# Patient Record
Sex: Male | Born: 1950 | Race: White | Hispanic: No | Marital: Married | Smoking: Former smoker
Health system: Southern US, Community
[De-identification: ages and names within clinical notes are randomized; demographics above are authoritative.]

## PROBLEM LIST (undated history)

## (undated) DIAGNOSIS — I2510 Atherosclerotic heart disease of native coronary artery without angina pectoris: Secondary | ICD-10-CM

## (undated) DIAGNOSIS — E785 Hyperlipidemia, unspecified: Secondary | ICD-10-CM

## (undated) DIAGNOSIS — I700 Atherosclerosis of aorta: Secondary | ICD-10-CM

## (undated) DIAGNOSIS — F329 Major depressive disorder, single episode, unspecified: Secondary | ICD-10-CM

## (undated) DIAGNOSIS — E559 Vitamin D deficiency, unspecified: Secondary | ICD-10-CM

## (undated) DIAGNOSIS — N529 Male erectile dysfunction, unspecified: Secondary | ICD-10-CM

## (undated) DIAGNOSIS — J449 Chronic obstructive pulmonary disease, unspecified: Secondary | ICD-10-CM

## (undated) DIAGNOSIS — Z87442 Personal history of urinary calculi: Secondary | ICD-10-CM

## (undated) DIAGNOSIS — G4730 Sleep apnea, unspecified: Secondary | ICD-10-CM

## (undated) DIAGNOSIS — L03114 Cellulitis of left upper limb: Secondary | ICD-10-CM

## (undated) DIAGNOSIS — L719 Rosacea, unspecified: Secondary | ICD-10-CM

## (undated) DIAGNOSIS — F32A Depression, unspecified: Secondary | ICD-10-CM

## (undated) HISTORY — PX: INCISION AND DRAINAGE ABSCESS: SHX5864

## (undated) HISTORY — DX: Chronic obstructive pulmonary disease, unspecified: J44.9

## (undated) HISTORY — DX: Atherosclerotic heart disease of native coronary artery without angina pectoris: I25.10

## (undated) HISTORY — DX: Rosacea, unspecified: L71.9

## (undated) HISTORY — DX: Atherosclerosis of aorta: I70.0

## (undated) HISTORY — DX: Vitamin D deficiency, unspecified: E55.9

## (undated) HISTORY — PX: KNEE ARTHROSCOPY: SUR90

## (undated) HISTORY — PX: SHOULDER SURGERY: SHX246

---

## 1981-01-29 HISTORY — PX: APPENDECTOMY: SHX54

## 1997-08-12 ENCOUNTER — Ambulatory Visit (HOSPITAL_BASED_OUTPATIENT_CLINIC_OR_DEPARTMENT_OTHER): Admission: RE | Admit: 1997-08-12 | Discharge: 1997-08-12 | Payer: Self-pay | Admitting: General Surgery

## 1997-11-05 ENCOUNTER — Ambulatory Visit (HOSPITAL_COMMUNITY): Admission: RE | Admit: 1997-11-05 | Discharge: 1997-11-05 | Payer: Self-pay | Admitting: Gastroenterology

## 1997-11-05 ENCOUNTER — Encounter: Payer: Self-pay | Admitting: Gastroenterology

## 1998-01-29 HISTORY — PX: HERNIA REPAIR: SHX51

## 1998-04-02 ENCOUNTER — Encounter: Payer: Self-pay | Admitting: Emergency Medicine

## 1998-04-02 ENCOUNTER — Emergency Department (HOSPITAL_COMMUNITY): Admission: EM | Admit: 1998-04-02 | Discharge: 1998-04-02 | Payer: Self-pay | Admitting: Emergency Medicine

## 2004-02-17 ENCOUNTER — Encounter: Admission: RE | Admit: 2004-02-17 | Discharge: 2004-02-17 | Payer: Self-pay | Admitting: Family Medicine

## 2004-04-11 ENCOUNTER — Ambulatory Visit (HOSPITAL_COMMUNITY): Admission: RE | Admit: 2004-04-11 | Discharge: 2004-04-11 | Payer: Self-pay | Admitting: *Deleted

## 2014-08-06 DIAGNOSIS — E782 Mixed hyperlipidemia: Secondary | ICD-10-CM | POA: Insufficient documentation

## 2014-08-06 DIAGNOSIS — L03114 Cellulitis of left upper limb: Secondary | ICD-10-CM

## 2014-08-06 DIAGNOSIS — F329 Major depressive disorder, single episode, unspecified: Secondary | ICD-10-CM | POA: Insufficient documentation

## 2014-08-06 DIAGNOSIS — F32A Depression, unspecified: Secondary | ICD-10-CM | POA: Insufficient documentation

## 2014-08-06 DIAGNOSIS — L03119 Cellulitis of unspecified part of limb: Secondary | ICD-10-CM | POA: Insufficient documentation

## 2014-08-06 DIAGNOSIS — J449 Chronic obstructive pulmonary disease, unspecified: Secondary | ICD-10-CM | POA: Insufficient documentation

## 2014-08-06 HISTORY — DX: Cellulitis of left upper limb: L03.114

## 2015-04-20 DIAGNOSIS — R252 Cramp and spasm: Secondary | ICD-10-CM | POA: Insufficient documentation

## 2015-04-20 DIAGNOSIS — N529 Male erectile dysfunction, unspecified: Secondary | ICD-10-CM | POA: Insufficient documentation

## 2016-02-07 DIAGNOSIS — H40053 Ocular hypertension, bilateral: Secondary | ICD-10-CM | POA: Diagnosis not present

## 2016-02-07 DIAGNOSIS — H5213 Myopia, bilateral: Secondary | ICD-10-CM | POA: Diagnosis not present

## 2016-02-07 DIAGNOSIS — H2513 Age-related nuclear cataract, bilateral: Secondary | ICD-10-CM | POA: Diagnosis not present

## 2016-03-15 DIAGNOSIS — L700 Acne vulgaris: Secondary | ICD-10-CM | POA: Diagnosis not present

## 2016-03-15 DIAGNOSIS — L718 Other rosacea: Secondary | ICD-10-CM | POA: Diagnosis not present

## 2016-03-15 DIAGNOSIS — D485 Neoplasm of uncertain behavior of skin: Secondary | ICD-10-CM | POA: Diagnosis not present

## 2016-03-15 DIAGNOSIS — L821 Other seborrheic keratosis: Secondary | ICD-10-CM | POA: Diagnosis not present

## 2016-04-03 DIAGNOSIS — E789 Disorder of lipoprotein metabolism, unspecified: Secondary | ICD-10-CM | POA: Diagnosis not present

## 2016-04-03 DIAGNOSIS — M542 Cervicalgia: Secondary | ICD-10-CM | POA: Diagnosis not present

## 2016-04-03 DIAGNOSIS — F4320 Adjustment disorder, unspecified: Secondary | ICD-10-CM | POA: Diagnosis not present

## 2016-04-04 DIAGNOSIS — M1990 Unspecified osteoarthritis, unspecified site: Secondary | ICD-10-CM | POA: Diagnosis not present

## 2016-04-04 DIAGNOSIS — M542 Cervicalgia: Secondary | ICD-10-CM | POA: Diagnosis not present

## 2016-04-06 DIAGNOSIS — F17210 Nicotine dependence, cigarettes, uncomplicated: Secondary | ICD-10-CM | POA: Diagnosis not present

## 2016-04-06 DIAGNOSIS — G4733 Obstructive sleep apnea (adult) (pediatric): Secondary | ICD-10-CM | POA: Diagnosis not present

## 2016-04-06 DIAGNOSIS — J449 Chronic obstructive pulmonary disease, unspecified: Secondary | ICD-10-CM | POA: Diagnosis not present

## 2016-04-16 ENCOUNTER — Ambulatory Visit: Payer: PPO | Attending: Endocrinology | Admitting: Physical Therapy

## 2016-04-16 DIAGNOSIS — R293 Abnormal posture: Secondary | ICD-10-CM | POA: Insufficient documentation

## 2016-04-16 DIAGNOSIS — M25511 Pain in right shoulder: Secondary | ICD-10-CM | POA: Insufficient documentation

## 2016-04-16 DIAGNOSIS — G8929 Other chronic pain: Secondary | ICD-10-CM | POA: Diagnosis not present

## 2016-04-16 DIAGNOSIS — M542 Cervicalgia: Secondary | ICD-10-CM | POA: Insufficient documentation

## 2016-04-16 DIAGNOSIS — M5412 Radiculopathy, cervical region: Secondary | ICD-10-CM | POA: Diagnosis not present

## 2016-04-16 NOTE — Therapy (Signed)
[facility] [address]  [place], [place], [address] Phone: [phone]   Fax:  [phone]  Physical Therapy Evaluation  Patient Details  Name: Patrick [place] MRN: [id number] Date of Birth: 08-11-1950 Referring Provider: Arleta Creek, MD  Encounter Date: 04/16/2016      PT End of Session - 04/16/16 1445    Visit Number 1   Number of Visits 12   Date for PT Re-Evaluation 06/01/16   Authorization Type [employer] - follow Medicare guidelines   PT Start Time 3212   PT Stop Time 1535   PT Time Calculation (min) 50 min   Activity Tolerance Patient tolerated treatment well   Behavior During Therapy [facility] for tasks assessed/performed      No past medical history on file.  No past surgical history on file.  There were no vitals filed for this visit.       Subjective Assessment - 04/16/16 1451    Subjective Last May, pt reports picking up a banquet table when he felt a pull on the right side of his neck. Pain has fluctuated on and off since. Limits sleep and playing golf. Saw a chriopractor with minimal relief. States his rheumatologist has iven him some stretches and exercises, but again limited response noted. pain typically worst later in the day.   Patient Stated Goals "less pain"    Currently in Pain? No/denies   Pain Score 0-No pain  least 0/10, avg 7/10, worst 8-9/10   Pain Location Neck   Pain Orientation Right;Lateral   Pain Descriptors / Indicators Throbbing   Pain Type Chronic pain   Pain Radiating Towards intermittent tingling in L hand (middle fingers)   Pain Onset More than a month ago  May 2016   Pain Frequency Intermittent   Aggravating Factors  worst later in the day, moving neck - all directions, lying down   Pain Relieving Factors heat, Ibuprofen   Effect of Pain on Daily Activities pain with ADLs - applying deoderant, drying hair; difficulty turning head to check  blindspot while driving; limits playing golf            [facility] PT Assessment - 04/16/16 1445      Assessment   Medical Diagnosis R shoulder & neck pain   Referring Provider Arleta Creek, MD   Onset Date/Surgical Date --  May 2017   Hand Dominance Right     Balance Screen   Has the patient fallen in the past 6 months Yes   How many times? 2   Has the patient had a decrease in activity level because of a fear of falling?  No   Is the patient reluctant to leave their home because of a fear of falling?  No     Home Environment   Living Environment Private residence   Type of [place] to enter   Entrance Stairs-Number of Steps 13 from garage   Entrance Stairs-Rails Left   [place] of Steps tri-level house with 3-4 steps btw levels     Prior Function   Level of Independence Independent   Vocation Retired   Leisure walking at least 3x/wk; golf - weekly     Observation/Other Assessments   Focus on Therapeutic Outcomes (FOTO)  Neck 47% (53% limitation); predicted 61% (39% limitation)     Posture/Postural Control   Posture/Postural Control Postural limitations  Postural Limitations Forward head;Rounded Shoulders     ROM / Strength   AROM / PROM / Strength AROM;Strength     AROM   Overall AROM Comments B shoulder WNL but increased discomfort noted on R   AROM Assessment Site Cervical;Shoulder   Cervical Flexion 44  pain/stiffness   Cervical Extension 35  pain   Cervical - Right Side Bend 18  pain   Cervical - Left Side Bend 26  stiffness   Cervical - Right Rotation 34  pain   Cervical - Left Rotation 56     Strength   Overall Strength Within functional limits for tasks performed   Overall Strength Comments B shoulders 5/5   Strength Assessment Site Shoulder     Palpation   Palpation comment increased muscle tension in R cervical paraspinals, UT & pecs                   [facility]  Adult PT Treatment/Exercise - 04/16/16 1445      Self-Care   Self-Care Posture   Posture Instructed pt in neutral posture for spine and shoulders     Exercises   Exercises Neck     Neck Exercises: Seated   Neck Retraction 10 reps;5 secs   Other Seated Exercise Scapular retraction 10x5"     Neck Exercises: Stretches   Upper Trapezius Stretch 30 seconds;2 reps   Upper Trapezius Stretch Limitations seated with hand on edge of chair to avoid hiking shoulder   Corner Stretch 30 seconds;1 rep   Corner Stretch Limitations 3 way doorway stretch - low/mid/high                 PT Education - 04/16/16 1532    Education provided Yes   Education Details PT eval findings, POC, postural awareness & initial HEP   Person(s) Educated Patient   Methods Explanation;Demonstration;Handout   Comprehension Verbalized understanding;Returned demonstration;Need further instruction          PT Short Term Goals - 04/16/16 1535      PT SHORT TERM GOAL #1   Title Independent with initial HEP by 04/30/16   Status New     PT SHORT TERM GOAL #2   Title Pt will verbalize understanding of neutral spine and shoulder posture by 04/30/16   Status New           PT Long Term Goals - 04/16/16 1535      PT LONG TERM GOAL #1   Title Independent with advanced HEP as indicated by 06/01/16   Status New     PT LONG TERM GOAL #2   Title Cervical ROM WFL w/o increased pain by 06/01/16   Status New     PT LONG TERM GOAL #3   Title Pt will report no limitation in ADLs, daily chores, driving or leisure activities (golf) due to neck or R shoulder pain by 06/01/16   Status New               Plan - 04/16/16 1535    Clinical Impression Statement Patrick [place] is a 66 y/o R hand dominant male who presents to OP PT R neck and shoulder pain dating back to May 2017 when he felt sudden onset of pain while lifting a banquet table.  Pt reports it felt like he had pulled a muscle at the time but pain has fluctuated on  and off since, with some intermittent numbness and tingling in the L hand. Assessment revealed forward head and shoulder  posture with increased muscle tension in pecs, cervical paraspinals, upper trap and levator scapulae R>L. Cervical ROM decreased in all planes with tightness/stiffness reported in flexion, B rotation and sidebending; along with increased pain for all motions. B shoulder ROM and strength WNL. Pt ttp over R lateral cervical musculature. Manual cervical distraction resolved pain/radicular symptoms, but unable to reproduce symptoms with axial loading. Pain makes self-care ADLs difficult and limits the patient's tolerance for driving and playing golf. POC will focus on postural and body mechanics training to improve functional alignment, improving cervical flexibility along with soft tissue pliability, stretching to restore normal cervical ROM, scapular stabilization and shoulder strengthening, with manual therapy and modalities PRN for pain. May also consider mechanical traction, taping and/or DN as indicated.   Rehab Potential Good   PT Frequency 2x / week   PT Duration 6 weeks   PT Treatment/Interventions Patient/family education;Neuromuscular re-education;Therapeutic exercise;Therapeutic activities;Functional mobility training;Manual techniques;Dry needling;Taping;Traction;Electrical Stimulation;Moist Heat;Cryotherapy;Ultrasound;Iontophoresis 4mg /ml Dexamethasone;ADLs/Self Care Home Management   PT Next Visit Plan Review initial HEP; Posture & body mechanics education; Postural strengthening - scapular stabilization + anterior stretching; Manual therapy including manual traction - consider mechanical traction if continued benefit noted; Modalities PRN   Consulted and Agree with Plan of Care Patient      Patient will benefit from skilled therapeutic intervention in order to improve the following deficits and impairments:  Pain, Decreased range of motion, Impaired flexibility, Increased  muscle spasms, Postural dysfunction, Improper body mechanics, Impaired sensation, Decreased activity tolerance  Visit Diagnosis: Cervicalgia  Radiculopathy, cervical region  Chronic right shoulder pain  Abnormal posture      G-Codes - 05-10-16 1741    Functional Assessment Tool Used (Outpatient Only) Neck FOTO = 47% (53% limitation)   Functional Limitation Changing and maintaining body position   Changing and Maintaining Body Position Current Status (W2956) At least 40 percent but less than 60 percent impaired, limited or restricted   Changing and Maintaining Body Position Goal Status (O1308) At least 20 percent but less than 40 percent impaired, limited or restricted       Problem List There are no active problems to display for this patient.   Percival Spanish, PT, MPT 05/10/16, 6:16 PM  [facility] [address]  [place], [place], [address] Phone: [phone]   Fax:  [phone]  Name: Patrick [place] MRN: [id number] Date of Birth: 13-Jul-1950

## 2016-04-19 ENCOUNTER — Ambulatory Visit: Payer: PPO | Admitting: Physical Therapy

## 2016-04-25 ENCOUNTER — Ambulatory Visit: Payer: PPO

## 2016-04-25 DIAGNOSIS — M542 Cervicalgia: Secondary | ICD-10-CM

## 2016-04-25 DIAGNOSIS — R293 Abnormal posture: Secondary | ICD-10-CM

## 2016-04-25 DIAGNOSIS — G8929 Other chronic pain: Secondary | ICD-10-CM

## 2016-04-25 DIAGNOSIS — M25511 Pain in right shoulder: Secondary | ICD-10-CM

## 2016-04-25 DIAGNOSIS — M5412 Radiculopathy, cervical region: Secondary | ICD-10-CM

## 2016-04-25 NOTE — Therapy (Signed)
[facility] [address]  [place], [place], [address] Phone: [phone]   Fax:  [phone]  Physical Therapy Treatment  Patient Details  Name: Patrick [place] MRN: [id number] Date of Birth: 17-Oct-1950 Referring Provider: Arleta Creek, MD  Encounter Date: 04/25/2016      PT End of Session - 04/25/16 0859    Visit Number 2   Number of Visits 12   Date for PT Re-Evaluation 06/01/16   Authorization Type [employer] - follow Medicare guidelines   PT Start Time 0846   PT Stop Time 0945   PT Time Calculation (min) 59 min   Activity Tolerance Patient tolerated treatment well   Behavior During Therapy [facility] for tasks assessed/performed      No past medical history on file.  No past surgical history on file.  There were no vitals filed for this visit.      Subjective Assessment - 04/25/16 0847    Subjective Pt. reporting neck stiffness today.  Pt. reporting R UE numbness/tingling with nearly all motions still.     Patient Stated Goals "less pain"    Currently in Pain? No/denies   Multiple Pain Sites No                         [facility] Adult PT Treatment/Exercise - 04/25/16 0856      Self-Care   Self-Care Posture;Other Self-Care Comments   Posture postural handout; discussion with visual of proper computer set up and proper sitting/standing posture      Neck Exercises: Machines for Strengthening   UBE (Upper Arm Bike) UBE: Lvl 1.0, 3 min each way     Neck Exercises: Seated   Neck Retraction 10 reps;5 secs   Other Seated Exercise Scapular retraction 10x5"     Shoulder Exercises: Standing   Extension AROM;Both;10 reps;Theraband  5" hold    Theraband Level (Shoulder Extension) Level 2 (Red)   Row AROM;10 reps;Theraband;Both  5" hold    Theraband Level (Shoulder Row) Level 2 (Red)     Modalities   Modalities Electrical Stimulation;Moist Heat     Moist Heat Therapy    Number Minutes Moist Heat 15 Minutes   Moist Heat Location Cervical     Electrical Stimulation   Electrical Stimulation Location R cervical, R UT    Electrical Stimulation Action IFC    Electrical Stimulation Parameters 80-150 Hz, intensity to pt. tolerance, 15'    Electrical Stimulation Goals Pain     Neck Exercises: Stretches   Upper Trapezius Stretch 30 seconds;2 reps   Upper Trapezius Stretch Limitations seated with hand on edge of chair to avoid hiking shoulder   Levator Stretch 2 reps;30 seconds   Levator Stretch Limitations Seated with off arm anchored    Warehouse manager 30 seconds;2 reps   Corner Stretch Limitations 3 way doorway stretch - low/mid/high                   PT Short Term Goals - 04/25/16 0859      PT SHORT TERM GOAL #1   Title Independent with initial HEP by 04/30/16   Status On-going     PT SHORT TERM GOAL #2   Title Pt will verbalize understanding of neutral spine and shoulder posture by 04/30/16   Status On-going           PT Long Term Goals - 04/25/16 0859      PT  LONG TERM GOAL #1   Title Independent with advanced HEP as indicated by 06/01/16   Status On-going     PT LONG TERM GOAL #2   Title Cervical ROM WFL w/o increased pain by 06/01/16   Status On-going     PT LONG TERM GOAL #3   Title Pt will report no limitation in ADLs, daily chores, driving or leisure activities (golf) due to neck or R shoulder pain by 06/01/16   Status On-going               Plan - 04/25/16 1214    Clinical Impression Statement Pt. reporting pain free today however continued R UE tingling/numbness with nearly all motions.  Postural handout reviewed with pt. today focusing on computer desk set up (pt. uses I-pad frequently) and sitting/standing posture.  HEP review today with pt. reporting daily adherence and able to demo good overall technique.  Pt. reporting, "I'm tired of this and ready to get better".  Progressed to chest stretch lying on 1/2 foam bolster  today and introduction to gentle scapular strengthening with red TB.  Pt. tolerating all activities well however with numbness/tingling throughout.  E-stim/moist heat combo to end treatment and promote muscular relaxation.  Pt. will continue to benefit from further skilled therapy to reduce radicular symptoms, improve ROM, posture with functional activity.   PT Treatment/Interventions Patient/family education;Neuromuscular re-education;Therapeutic exercise;Therapeutic activities;Functional mobility training;Manual techniques;Dry needling;Taping;Traction;Electrical Stimulation;Moist Heat;Cryotherapy;Ultrasound;Iontophoresis 4mg /ml Dexamethasone;ADLs/Self Care Home Management   PT Next Visit Plan Continue posture and body mechanics education; Postural strengthening - scapular stabilization + anterior stretching; Manual therapy including manual traction - consider mechanical traction if continued benefit noted; Modalities PRN      Patient will benefit from skilled therapeutic intervention in order to improve the following deficits and impairments:  Pain, Decreased range of motion, Impaired flexibility, Increased muscle spasms, Postural dysfunction, Improper body mechanics, Impaired sensation, Decreased activity tolerance  Visit Diagnosis: Cervicalgia  Radiculopathy, cervical region  Chronic right shoulder pain  Abnormal posture     Problem List There are no active problems to display for this patient.   Bess Harvest, PTA 04/25/16 12:24 PM  [facility] [address]  [place], [place], [address] Phone: [phone]   Fax:  [phone]  Name: Patrick [place] MRN: [id number] Date of Birth: [birth year]

## 2016-04-25 NOTE — Patient Instructions (Signed)

## 2016-04-27 ENCOUNTER — Ambulatory Visit: Payer: PPO

## 2016-04-27 DIAGNOSIS — M5412 Radiculopathy, cervical region: Secondary | ICD-10-CM

## 2016-04-27 DIAGNOSIS — M542 Cervicalgia: Secondary | ICD-10-CM

## 2016-04-27 DIAGNOSIS — Z87891 Personal history of nicotine dependence: Secondary | ICD-10-CM | POA: Diagnosis not present

## 2016-04-27 NOTE — Therapy (Signed)
[facility] [address]  [place], [place], [address] Phone: [phone]   Fax:  [phone]  Physical Therapy Treatment  Patient Details  Name: Patrick [place] MRN: [id number] Date of Birth: 05/03/1950 Referring Provider: Arleta Creek, MD  Encounter Date: 04/27/2016      PT End of Session - 04/27/16 0828    Visit Number 3   Number of Visits 12   Date for PT Re-Evaluation 06/01/16   Authorization Type [employer] - follow Medicare guidelines   PT Start Time 0800   PT Stop Time 0900   PT Time Calculation (min) 60 min   Activity Tolerance Patient tolerated treatment well   Behavior During Therapy [facility] for tasks assessed/performed      No past medical history on file.  No past surgical history on file.  There were no vitals filed for this visit.      Subjective Assessment - 04/27/16 0803    Subjective Pt. reporting neck stiffness this morning which has lessened since waking up.  some continued numbness in R middle two fingers.     Patient Stated Goals "less pain"    Currently in Pain? No/denies   Pain Score 0-No pain   Multiple Pain Sites No                         [facility] Adult PT Treatment/Exercise - 04/27/16 0805      Shoulder Exercises: Supine   Horizontal ABduction AROM;Strengthening;15 reps;Theraband   Theraband Level (Shoulder Horizontal ABduction) Level 3 (Green)   Horizontal ABduction Limitations Laying on 1/2 foam bolster    External Rotation AROM;Strengthening;Both;15 reps;Theraband   Theraband Level (Shoulder External Rotation) Level 3 (Green)   External Rotation Limitations laying on 1/2 foam bolster    Other Supine Exercises alteranting shoulder flexion +opposite arm extension (X pattern) with green TB x 15 reps each way; laying on 1/2 foam bolster      Shoulder Exercises: Standing   Extension AROM;Both;Theraband;15 reps   Theraband Level (Shoulder  Extension) Level 3 (Green)   Row AROM;Theraband;Both;15 reps   Theraband Level (Shoulder Row) Level 3 (Green)     Moist Heat Therapy   Number Minutes Moist Heat 15 Minutes   Moist Heat Location Cervical  and upper back in hooklying     Electrical Stimulation   Electrical Stimulation Location R cervical, R UT    Electrical Stimulation Action IFC   Electrical Stimulation Parameters 80-150Hz , intensity to pt. tolerance, 15 min    Electrical Stimulation Goals Pain     Manual Therapy   Manual Therapy Manual Traction;Passive ROM   Manual therapy comments Gentle manual cervical traction x 2 min    Passive ROM manual B UT, Levator scap. stretch with therapist x 1 min each way   Manual Traction Gentle manual cervical traction x 2 min; good relief noted with this      Neck Exercises: Stretches   Programmer, systems Limitations 3 way doorway stretch - low/mid/high                 PT Education - 04/27/16 1121    Education provided Yes   Education Details Standing row, row/extension with green TB issued to pt., supine laying on 1/2 pool noodle chest stretch    Person(s) Educated Patient   Methods Explanation;Demonstration;Verbal cues;Handout   Comprehension Verbalized understanding;Returned demonstration;Verbal cues required;Need further instruction  PT Short Term Goals - 04/27/16 1108      PT SHORT TERM GOAL #1   Title Independent with initial HEP by 04/30/16   Status Achieved     PT SHORT TERM GOAL #2   Title Pt will verbalize understanding of neutral spine and shoulder posture by 04/30/16   Status On-going           PT Long Term Goals - 04/25/16 0859      PT LONG TERM GOAL #1   Title Independent with advanced HEP as indicated by 06/01/16   Status On-going     PT LONG TERM GOAL #2   Title Cervical ROM WFL w/o increased pain by 06/01/16   Status On-going     PT LONG TERM GOAL #3   Title Pt will report no limitation in ADLs,  daily chores, driving or leisure activities (golf) due to neck or R shoulder pain by 06/01/16   Status On-going               Plan - 04/27/16 0903    Clinical Impression Statement Pt. pain free today however reporting continued stiffness in neck and numbness in R middle two fingers.  Today's treatment with progression of scapular strengthening to green TB.  Pt. tolerated progression well and able to demo good technique thus HEP updated to include scapular strengthening with green TB issued to pt.  Manual stretching to cervical musculature today with good relief reported.  Pt. still noting benefit from moist heat/E-stim. Combo thus treatment ending with this.  Pt. progressing well at this point.  Pt. will continue to benefit from further skilled therapy to improve cervical ROM and reduce pain with leisure and daily activities.     PT Treatment/Interventions Patient/family education;Neuromuscular re-education;Therapeutic exercise;Therapeutic activities;Functional mobility training;Manual techniques;Dry needling;Taping;Traction;Electrical Stimulation;Moist Heat;Cryotherapy;Ultrasound;Iontophoresis 4mg /ml Dexamethasone;ADLs/Self Care Home Management   PT Next Visit Plan Continue posture and body mechanics education; Postural strengthening - scapular stabilization + anterior stretching; Manual therapy including manual traction - consider mechanical traction if continued benefit noted; Modalities PRN      Patient will benefit from skilled therapeutic intervention in order to improve the following deficits and impairments:  Pain, Decreased range of motion, Impaired flexibility, Increased muscle spasms, Postural dysfunction, Improper body mechanics, Impaired sensation, Decreased activity tolerance  Visit Diagnosis: Cervicalgia  Radiculopathy, cervical region     Problem List There are no active problems to display for this patient.  Bess Harvest, PTA 04/27/16 11:34 AM  [facility] [address]  [place], [place], [address] Phone: [phone]   Fax:  [phone]  Name: Patrick [place] MRN: [id number] Date of Birth: [birth year]

## 2016-05-01 ENCOUNTER — Ambulatory Visit: Payer: PPO | Attending: Endocrinology

## 2016-05-01 DIAGNOSIS — M5412 Radiculopathy, cervical region: Secondary | ICD-10-CM | POA: Diagnosis not present

## 2016-05-01 DIAGNOSIS — G8929 Other chronic pain: Secondary | ICD-10-CM | POA: Insufficient documentation

## 2016-05-01 DIAGNOSIS — R293 Abnormal posture: Secondary | ICD-10-CM | POA: Insufficient documentation

## 2016-05-01 DIAGNOSIS — M25511 Pain in right shoulder: Secondary | ICD-10-CM | POA: Diagnosis not present

## 2016-05-01 DIAGNOSIS — M542 Cervicalgia: Secondary | ICD-10-CM | POA: Diagnosis not present

## 2016-05-01 NOTE — Therapy (Signed)
[facility] [address]  [place], [place], [address] Phone: [phone]   Fax:  [phone]  Physical Therapy Treatment  Patient Details  Name: ROCK SOBOL MRN: [id number] Date of Birth: 1950-06-23 Referring Provider: Arleta Creek, MD  Encounter Date: 05/01/2016      PT End of Session - 05/01/16 0853    Visit Number 4   Number of Visits 12   Date for PT Re-Evaluation 06/01/16   Authorization Type [employer] - follow Medicare guidelines   PT Start Time 0845   PT Stop Time 0945   PT Time Calculation (min) 60 min   Activity Tolerance Patient tolerated treatment well   Behavior During Therapy [facility] for tasks assessed/performed      No past medical history on file.  No past surgical history on file.  There were no vitals filed for this visit.      Subjective Assessment - 05/01/16 0852    Subjective Pt. reporting some numbness in L middle finger.     Patient Stated Goals "less pain"    Currently in Pain? No/denies   Pain Score 0-No pain   Multiple Pain Sites No                         [facility] Adult PT Treatment/Exercise - 05/01/16 0857      Neck Exercises: Machines for Strengthening   Other Machines for Strengthening BATCA low row 35# x 10 reps  5" hold      Neck Exercises: Standing   Other Standing Exercises Standing TRX row 5" x 10 reps     Shoulder Exercises: Supine   Horizontal ABduction AROM;Strengthening;Theraband;10 reps   Theraband Level (Shoulder Horizontal ABduction) Level 4 (Blue)   Horizontal ABduction Limitations laying on 6" bolster    External Rotation AROM;Strengthening;Both;Theraband;10 reps   Theraband Level (Shoulder External Rotation) Level 4 (Blue)   External Rotation Limitations laying on 6" bolster      Moist Heat Therapy   Number Minutes Moist Heat 15 Minutes   Moist Heat Location Cervical     Electrical Stimulation   Electrical  Stimulation Location B cervical/UT   Electrical Stimulation Action IFC   Electrical Stimulation Parameters 80-150Hz , intensity to pt. tolerance, 15 min    Electrical Stimulation Goals Tone;Pain     Manual Therapy   Manual Therapy Manual Traction;Passive ROM   Manual therapy comments Gentle manual cervical traction x 2 min    Passive ROM manual B UT, Levator scap. stretch with therapist x 1 min each way   Manual Traction Gentle manual cervical traction x 2 min; good relief noted with this      Neck Exercises: Land 30 seconds;2 reps   Corner Stretch Limitations 3 way doorway stretch - low/mid/high    Chest Stretch 2 reps;30 seconds                  PT Short Term Goals - 05/01/16 1038      PT SHORT TERM GOAL #1   Title Independent with initial HEP by 04/30/16   Status Achieved     PT SHORT TERM GOAL #2   Title Pt will verbalize understanding of neutral spine and shoulder posture by 04/30/16   Status Achieved  4.3.18: pt. reporting increased awareness of posture with prolonged sitting and reduced use of I-pad while sitting on couch.  PT Long Term Goals - 04/25/16 0859      PT LONG TERM GOAL #1   Title Independent with advanced HEP as indicated by 06/01/16   Status On-going     PT LONG TERM GOAL #2   Title Cervical ROM WFL w/o increased pain by 06/01/16   Status On-going     PT LONG TERM GOAL #3   Title Pt will report no limitation in ADLs, daily chores, driving or leisure activities (golf) due to neck or R shoulder pain by 06/01/16   Status On-going               Plan - 05/01/16 0854    Clinical Impression Statement Pt. doing well today however still with report of numbness in middle fingers on both hands L>R today.  Today's treatment focusing on manual stretching to cervical musculature and review of self-stretch for home use.  Pt. able to self-demo without cueing and reports consistent performance of, "neck stretching" at home.   Discussion of proper sitting posture with pt. today with pt. able to verbalize increased awareness of proper posture and decreased use of I-pad in, "slumped" positioning.  Some progression of scapular strengthening and chest stretching.  Progressed to 6" bolster with supine scapular stability activities and blue TB.  Treatment ending with E-stim/moist heat to promote reduction in tone and post-exercise pain.  Pt. with 3 week road trip vacation scheduled to leave on 4.27.18.  Some relief with manual cervical traction today; will plan trial of mechanical traction next treatment if radicular symptoms persist.     PT Treatment/Interventions Patient/family education;Neuromuscular re-education;Therapeutic exercise;Therapeutic activities;Functional mobility training;Manual techniques;Dry needling;Taping;Traction;Electrical Stimulation;Moist Heat;Cryotherapy;Ultrasound;Iontophoresis 4mg /ml Dexamethasone;ADLs/Self Care Home Management   PT Next Visit Plan Trial of mechanical traction; Continue posture and body mechanics education; Postural strengthening - scapular stabilization + anterior stretching; Manual therapy including manual traction - consider mechanical traction if continued benefit noted; Modalities PRN      Patient will benefit from skilled therapeutic intervention in order to improve the following deficits and impairments:  Pain, Decreased range of motion, Impaired flexibility, Increased muscle spasms, Postural dysfunction, Improper body mechanics, Impaired sensation, Decreased activity tolerance  Visit Diagnosis: Cervicalgia  Radiculopathy, cervical region  Chronic right shoulder pain  Abnormal posture     Problem List There are no active problems to display for this patient.   Bess Harvest, PTA 05/01/16 10:53 AM  [facility] [address]  [place], [place], [address] Phone: [phone]   Fax:  [phone]  Name:  FLAY GHOSH MRN: [id number] Date of Birth: October 15, 1950

## 2016-05-04 ENCOUNTER — Ambulatory Visit: Payer: PPO | Admitting: Physical Therapy

## 2016-05-04 DIAGNOSIS — G8929 Other chronic pain: Secondary | ICD-10-CM

## 2016-05-04 DIAGNOSIS — M5412 Radiculopathy, cervical region: Secondary | ICD-10-CM

## 2016-05-04 DIAGNOSIS — M542 Cervicalgia: Secondary | ICD-10-CM

## 2016-05-04 DIAGNOSIS — M25511 Pain in right shoulder: Secondary | ICD-10-CM

## 2016-05-04 DIAGNOSIS — R293 Abnormal posture: Secondary | ICD-10-CM

## 2016-05-04 NOTE — Therapy (Signed)
[facility] [address]  [place], [place], [address] Phone: [phone]   Fax:  [phone]  Physical Therapy Treatment  Patient Details  Name: Patrick Craig MRN: [id number] Date of Birth: Sep 25, 1950 Referring Provider: Arleta Creek, MD  Encounter Date: 05/04/2016      PT End of Session - 05/04/16 0848    Visit Number 5   Number of Visits 12   Date for PT Re-Evaluation 06/01/16   Authorization Type [employer] - follow Medicare guidelines   PT Start Time 0848   PT Stop Time 0944   PT Time Calculation (min) 56 min   Activity Tolerance Patient tolerated treatment well   Behavior During Therapy [facility] for tasks assessed/performed      No past medical history on file.  No past surgical history on file.  There were no vitals filed for this visit.      Subjective Assessment - 05/04/16 0849    Subjective Pt reports still having to take 2 Advil 2x/day. Pain intermittent currently, typically with extremes of motion or overhead lifting, but up to 6/10 when present. Does feel like he is sleeping better.   Patient Stated Goals "less pain"    Currently in Pain? No/denies   Pain Score --  up to 6/10                         [facility] Adult PT Treatment/Exercise - 05/04/16 0848      Neck Exercises: Machines for Strengthening   UBE (Upper Arm Bike) lvl 2.5 fwd/back x3' each   Cybex Row 25# x10, 35# x10     Neck Exercises: Supine   Other Supine Exercise L median nerve glide 2x10     Modalities   Modalities Traction     Traction   Type of Traction Cervical   Min (lbs) 8   Max (lbs) 16   Hold Time 60   Rest Time 20   Time 10     Manual Therapy   Manual Therapy Manual Traction;Soft tissue mobilization;Passive ROM   Manual therapy comments pt supine   Soft tissue mobilization STM & manual stretching to B UT, LS & pecs   Passive ROM ROM through all cervical plane with slight  distraction - pt reporting decreased pain with this   Manual Traction Cervical distraction 5x30"      Neck Exercises: Stretches   Upper Trapezius Stretch 30 seconds;2 reps   Upper Trapezius Stretch Limitations seated with hand on edge of chair to avoid hiking shoulder   Levator Stretch 30 seconds;2 reps   Levator Stretch Limitations seated with hand on edge of chair to avoid hiking shoulder                PT Education - 05/04/16 0930    Education provided Yes   Education Details HEP update - median nerve glide & LS stretch   Person(s) Educated Patient   Methods Explanation;Demonstration;Handout   Comprehension Verbalized understanding;Returned demonstration;Need further instruction          PT Short Term Goals - 05/04/16 0944      PT SHORT TERM GOAL #1   Title Independent with initial HEP by 04/30/16   Status Achieved     PT SHORT TERM GOAL #2   Title Pt will verbalize understanding of neutral spine and shoulder posture by 04/30/16   Status Achieved  PT Long Term Goals - 04/25/16 0859      PT LONG TERM GOAL #1   Title Independent with advanced HEP as indicated by 06/01/16   Status On-going     PT LONG TERM GOAL #2   Title Cervical ROM WFL w/o increased pain by 06/01/16   Status On-going     PT LONG TERM GOAL #3   Title Pt will report no limitation in ADLs, daily chores, driving or leisure activities (golf) due to neck or R shoulder pain by 06/01/16   Status On-going               Plan - 05/04/16 0854    Clinical Impression Statement Pt reporting pain still present up to 6/10 with extremes of cervical motion, but able to tolerate increased ROM when slight distraction applied. Intermittent middle finger numbness also persists with certain head positions but lessened after introduction of medial nerve glides. PT continues to not benefit from manual traction, therefore treatment concluded with mechanical traction.   Rehab Potential Good   PT  Treatment/Interventions Patient/family education;Neuromuscular re-education;Therapeutic exercise;Therapeutic activities;Functional mobility training;Manual techniques;Dry needling;Taping;Traction;Electrical Stimulation;Moist Heat;Cryotherapy;Ultrasound;Iontophoresis 4mg /ml Dexamethasone;ADLs/Self Care Home Management   PT Next Visit Plan Assess resposne to mechanical traction & HEP update; Continue posture and body mechanics education; Postural strengthening - scapular stabilization + anterior stretching; Manual therapy including manual traction +/- mechanical traction if continued benefit noted; Modalities PRN   Consulted and Agree with Plan of Care Patient      Patient will benefit from skilled therapeutic intervention in order to improve the following deficits and impairments:  Pain, Decreased range of motion, Impaired flexibility, Increased muscle spasms, Postural dysfunction, Improper body mechanics, Impaired sensation, Decreased activity tolerance  Visit Diagnosis: Cervicalgia  Radiculopathy, cervical region  Chronic right shoulder pain  Abnormal posture     Problem List There are no active problems to display for this patient.   Percival Spanish, PT, MPT 05/04/2016, 10:55 AM  [facility] [address]  [place], [place], [address] Phone: [phone]   Fax:  [phone]  Name: Patrick Craig MRN: [id number] Date of Birth: 1950/09/16

## 2016-05-07 ENCOUNTER — Ambulatory Visit: Payer: PPO

## 2016-05-07 DIAGNOSIS — M25511 Pain in right shoulder: Secondary | ICD-10-CM

## 2016-05-07 DIAGNOSIS — R293 Abnormal posture: Secondary | ICD-10-CM

## 2016-05-07 DIAGNOSIS — M542 Cervicalgia: Secondary | ICD-10-CM

## 2016-05-07 DIAGNOSIS — M5412 Radiculopathy, cervical region: Secondary | ICD-10-CM

## 2016-05-07 DIAGNOSIS — G8929 Other chronic pain: Secondary | ICD-10-CM

## 2016-05-07 NOTE — Therapy (Signed)
[facility] [address]  [place], [place], [address] Phone: [phone]   Fax:  [phone]  Physical Therapy Treatment  Patient Details  Name: Patrick Craig MRN: [id number] Date of Birth: March 26, 1950 Referring Provider: Arleta Creek, MD  Encounter Date: 05/07/2016      PT End of Session - 05/07/16 1453    Visit Number 6   Number of Visits 12   Date for PT Re-Evaluation 06/01/16   Authorization Type [employer] - follow Medicare guidelines   PT Start Time 6962   PT Stop Time 1537   PT Time Calculation (min) 50 min   Activity Tolerance Patient tolerated treatment well   Behavior During Therapy [facility] for tasks assessed/performed      No past medical history on file.  No past surgical history on file.  There were no vitals filed for this visit.      Subjective Assessment - 05/07/16 1448    Subjective Pt. reporting he had worse neck pain following watching golf on TV for >3hrs yesterda.  Pt. pain free today however still with occasional L UE numbness in middle two fingers.     Patient Stated Goals "less pain"    Currently in Pain? No/denies   Pain Score 0-No pain   Multiple Pain Sites No                         [facility] Adult PT Treatment/Exercise - 05/07/16 1504      Neck Exercises: Machines for Strengthening   Cybex Row 35# x 15 reps 5" hold      Neck Exercises: Standing   Other Standing Exercises Standing TRX low row 5" x 10 reps     Shoulder Exercises: Standing   External Rotation AROM;Both;Strengthening;15 reps;Theraband   Theraband Level (Shoulder External Rotation) Level 3 (Green)  on doorseal    Extension AROM;Both;Theraband;15 reps   Theraband Level (Shoulder Extension) Level 4 (Blue)     Traction   Type of Traction Cervical   Min (lbs) 9   Max (lbs) 17    Hold Time 60   Rest Time 20   Time 11     Manual Therapy   Manual Therapy Manual Traction;Soft  tissue mobilization;Passive ROM   Manual therapy comments pt supine   Soft tissue mobilization STM & manual stretching to B UT, LS; STM/prolonged stretch on 1/2 foam bolster to B pecs   Passive ROM ROM through all cervical plane with slight distraction - good relief with this    Manual Traction Cervical distraction 2 x 30"      Neck Exercises: Stretches   Upper Trapezius Stretch Limitations seated with hand on edge of chair to avoid hiking shoulder   Levator Stretch Limitations seated with hand on edge of chair to avoid hiking shoulder   Corner Stretch 30 seconds;2 reps  just mid and high    Corner Stretch Limitations 2 way doorway stretch - mid/high                   PT Short Term Goals - 05/04/16 0944      PT SHORT TERM GOAL #1   Title Independent with initial HEP by 04/30/16   Status Achieved     PT SHORT TERM GOAL #2   Title Pt will verbalize understanding of neutral spine and shoulder posture by 04/30/16   Status Achieved  PT Long Term Goals - 04/25/16 0859      PT LONG TERM GOAL #1   Title Independent with advanced HEP as indicated by 06/01/16   Status On-going     PT LONG TERM GOAL #2   Title Cervical ROM WFL w/o increased pain by 06/01/16   Status On-going     PT LONG TERM GOAL #3   Title Pt will report no limitation in ADLs, daily chores, driving or leisure activities (golf) due to neck or R shoulder pain by 06/01/16   Status On-going               Plan - 05/07/16 1453    Clinical Impression Statement Pt. pain free today however still noting occasional middle finger numbness in L UE.  Pt. responding well to manual stretching to cervical musculature and still reports relief from this.  Pt. reporting benefit from mechanical traction thus this progressed today to 17#/9# today.  Scapular strengthening continued with good tolerance.  Pt. noting increased pain in neck yesterday night however feels this was brought on by watching golf in the same  position for >3hrs last night.  Pt. seems to be progressing well at this point and still reports consistent adherence to HEP.  Pt. with good recall and demo of updated HEP today.     PT Treatment/Interventions Patient/family education;Neuromuscular re-education;Therapeutic exercise;Therapeutic activities;Functional mobility training;Manual techniques;Dry needling;Taping;Traction;Electrical Stimulation;Moist Heat;Cryotherapy;Ultrasound;Iontophoresis 4mg /ml Dexamethasone;ADLs/Self Care Home Management   PT Next Visit Plan Continue posture and body mechanics education; Postural strengthening - scapular stabilization + anterior stretching; Manual therapy including manual traction +/- mechanical traction if continued benefit noted; Modalities PRN      Patient will benefit from skilled therapeutic intervention in order to improve the following deficits and impairments:  Pain, Decreased range of motion, Impaired flexibility, Increased muscle spasms, Postural dysfunction, Improper body mechanics, Impaired sensation, Decreased activity tolerance  Visit Diagnosis: Cervicalgia  Radiculopathy, cervical region  Chronic right shoulder pain  Abnormal posture     Problem List There are no active problems to display for this patient.   Bess Harvest, PTA 05/07/16 6:45 PM   [facility] [address]  [place], [place], [address] Phone: [phone]   Fax:  [phone]  Name: MAYES SANGIOVANNI MRN: [id number] Date of Birth: 11-19-1950

## 2016-05-08 ENCOUNTER — Ambulatory Visit: Payer: PPO

## 2016-05-08 DIAGNOSIS — G8929 Other chronic pain: Secondary | ICD-10-CM

## 2016-05-08 DIAGNOSIS — M5412 Radiculopathy, cervical region: Secondary | ICD-10-CM

## 2016-05-08 DIAGNOSIS — M25511 Pain in right shoulder: Secondary | ICD-10-CM

## 2016-05-08 DIAGNOSIS — R293 Abnormal posture: Secondary | ICD-10-CM

## 2016-05-08 DIAGNOSIS — M542 Cervicalgia: Secondary | ICD-10-CM

## 2016-05-08 NOTE — Therapy (Signed)
[facility] [address]  [place], [place], [address] Phone: [phone]   Fax:  [phone]  Physical Therapy Treatment  Patient Details  Name: Patrick Craig MRN: [id number] Date of Birth: [birth year] Referring Provider: Arleta Creek, MD  Encounter Date: 05/08/2016      PT End of Session - 05/08/16 1029    Visit Number 7   Number of Visits 12   Date for PT Re-Evaluation 06/01/16   Authorization Type [employer] - follow Medicare guidelines   PT Start Time 6962   PT Stop Time 1114   PT Time Calculation (min) 59 min   Activity Tolerance Patient tolerated treatment well   Behavior During Therapy [facility] for tasks assessed/performed      No past medical history on file.  No past surgical history on file.  There were no vitals filed for this visit.    Subjective        Subjective Assessment - 05/08/16 1127    Subjective Pt. reporting less neck stiiffness today.     Patient Stated Goals "less pain"    Currently in Pain? No/denies   Pain Score 0-No pain   Multiple Pain Sites No                      [facility] Adult PT Treatment/Exercise - 05/08/16 1026      Neck Exercises: Machines for Strengthening   Cybex Row 35# x 15 reps 5" hold      Neck Exercises: Prone   W Back --   W Back Limitations --   Shoulder Extension --   Shoulder Extension Limitations --   Rows Limitations --   Other Prone Exercise --     Shoulder Exercises: Prone   Extension AROM;Both;10 reps   Extension Limitations I's Prone on green P-ball (65cm)   External Rotation AROM;Both;10 reps   External Rotation Limitations W's Prone on green p-ball (65cm)   Horizontal ABduction 1 AROM;Both;Strengthening;10 reps   Horizontal ABduction 1 Limitations T's prone on green P-ball (65cm)    Horizontal ABduction 2 AROM;Both;Strengthening;10 reps   Horizontal ABduction 2 Limitations Y's Prone on green P-ball (65cm)      Traction   Type of Traction Cervical   Min (lbs) 9   Max (lbs) 17    Hold Time 60   Rest Time 20   Time 12     Manual Therapy   Manual Therapy Manual Traction;Soft tissue mobilization;Passive ROM;Myofascial release   Manual therapy comments pt supine   Soft tissue mobilization STM & manual stretching to B UT, LS; STM/prolonged stretch on 1/2 foam bolster to B pecs  performed with cervical hot pack    Myofascial Release MFR/TPR to R UT; Pt. very TTP midway between acromion and C7; good relief reported following this    Passive ROM ROM through all cervical plane with slight distraction - good relief with this   performed with cervical hot pack    Manual Traction Cervical distraction 2 min   performed with cervical hot pack      Neck Exercises: Stretches   Other Neck Stretches Supine chest stretch in cross position on 1/2 foam bolster x 1 min                   PT Short Term Goals - 05/04/16 0944      PT SHORT TERM GOAL #1   Title Independent with initial HEP by 04/30/16  Status Achieved     PT SHORT TERM GOAL #2   Title Pt will verbalize understanding of neutral spine and shoulder posture by 04/30/16   Status Achieved           PT Long Term Goals - 04/25/16 0859      PT LONG TERM GOAL #1   Title Independent with advanced HEP as indicated by 06/01/16   Status On-going     PT LONG TERM GOAL #2   Title Cervical ROM WFL w/o increased pain by 06/01/16   Status On-going     PT LONG TERM GOAL #3   Title Pt will report no limitation in ADLs, daily chores, driving or leisure activities (golf) due to neck or R shoulder pain by 06/01/16   Status On-going               Plan - 05/08/16 1112    Clinical Impression Statement Pt. reporting decreased, "neck stiffness" today and feels this is due to mechanical traction and stretching.  Focus of today's visit was manual stretching and STM to cervical and shoulder musculature.  Manual stretching performed with cervical  hot pack today with good relief.  Pt. TTP in R UT today with palpable muscular, "knot" in this area.  Limited response to TPR however pt. reporting good relief with STM/TPR to thus area.  Treatment ending with mechanical traction with moist heat to upper back musculature.  Pt. still noting improvement with less stiffness and improved neck motion since starting therapy.  [place] to therapy next week.     PT Treatment/Interventions Patient/family education;Neuromuscular re-education;Therapeutic exercise;Therapeutic activities;Functional mobility training;Manual techniques;Dry needling;Taping;Traction;Electrical Stimulation;Moist Heat;Cryotherapy;Ultrasound;Iontophoresis 4mg /ml Dexamethasone;ADLs/Self Care Home Management   PT Next Visit Plan Monitor response to TPR to R UT and traction; Continue posture and body mechanics education; Postural strengthening - scapular stabilization + anterior stretching; Manual therapy including manual traction +/- mechanical traction if continued benefit noted; Modalities PRN      Patient will benefit from skilled therapeutic intervention in order to improve the following deficits and impairments:  Pain, Decreased range of motion, Impaired flexibility, Increased muscle spasms, Postural dysfunction, Improper body mechanics, Impaired sensation, Decreased activity tolerance  Visit Diagnosis: Cervicalgia  Radiculopathy, cervical region  Chronic right shoulder pain  Abnormal posture     Problem List There are no active problems to display for this patient.   Bess Harvest, PTA 05/08/16 11:31 AM   [facility] [address]  [place], [place], [address] Phone: [phone]   Fax:  [phone]  Name: Patrick Craig MRN: [id number] Date of Birth: [birth year]

## 2016-05-09 ENCOUNTER — Encounter: Payer: PPO | Admitting: Physical Therapy

## 2016-05-14 ENCOUNTER — Ambulatory Visit: Payer: PPO | Admitting: Physical Therapy

## 2016-05-14 DIAGNOSIS — M5412 Radiculopathy, cervical region: Secondary | ICD-10-CM

## 2016-05-14 DIAGNOSIS — M542 Cervicalgia: Secondary | ICD-10-CM

## 2016-05-14 DIAGNOSIS — R293 Abnormal posture: Secondary | ICD-10-CM

## 2016-05-14 DIAGNOSIS — G8929 Other chronic pain: Secondary | ICD-10-CM

## 2016-05-14 DIAGNOSIS — M25511 Pain in right shoulder: Secondary | ICD-10-CM

## 2016-05-14 NOTE — Therapy (Addendum)
[facility] [facility] [address]  [place], [place], [address] Phone: [phone]   Fax:  [phone]  Physical Therapy Treatment  Patient Details  Name: Patrick Craig MRN: [id number] Date of Birth: 12/02/1950 Referring Provider: Arleta Creek, MD  Encounter Date: 05/14/2016      PT End of Session - 05/14/16 1450    Visit Number 8   Number of Visits 12   Date for PT Re-Evaluation 06/01/16   Authorization Type [employer] - follow Medicare guidelines   PT Start Time 1450   PT Stop Time 1534   PT Time Calculation (min) 44 min   Activity Tolerance Patient tolerated treatment well   Behavior During Therapy [facility] for tasks assessed/performed      No past medical history on file.  No past surgical history on file.  There were no vitals filed for this visit.      Subjective Assessment - 05/14/16 1457    Subjective Pt continues to reports pain/TPs in R lateral neck and UT.   Patient Stated Goals "less pain"    Currently in Pain? Yes   Pain Score 6    Pain Location Neck  & upper trap   Pain Orientation Left;Lateral   Pain Descriptors / Indicators Sharp   Pain Type Chronic pain                         [facility] Adult PT Treatment/Exercise - 05/14/16 1450      Neck Exercises: Machines for Strengthening   UBE (Upper Arm Bike) lvl 2.5 fwd/back x3' each     Manual Therapy   Manual Therapy Joint mobilization;Soft tissue mobilization;Myofascial release;Manual Traction;Passive ROM;Taping   Manual therapy comments pt supine   Joint Mobilization CPA and R UPA to C3-5   Soft tissue mobilization STM & manual stretching to B UT, LS & pecs   Myofascial Release mutiple TPR to R UT (midway between acromion and C7 and proximal over C3-4 transvere processes)   Passive ROM ROM through all cervical plane with slight distraction - pt reporting decreased pain with this   Manual Traction Cervical  distraction 10x30"    Kinesiotex Inhibit Muscle     Kinesiotix   Inhibit Muscle  B UT - 30% from acromium to lateral cervical transverse processes, 30% "V" from C7 spinous process to lateral transverse processes     Neck Exercises: Stretches   Upper Trapezius Stretch 30 seconds;3 reps   Upper Trapezius Stretch Limitations Bil - manual by PT   Levator Stretch 30 seconds;2 reps   Levator Stretch Limitations Bil - manual by PT                  PT Short Term Goals - 05/04/16 0944      PT SHORT TERM GOAL #1   Title Independent with initial HEP by 04/30/16   Status Achieved     PT SHORT TERM GOAL #2   Title Pt will verbalize understanding of neutral spine and shoulder posture by 04/30/16   Status Achieved           PT Long Term Goals - 04/25/16 0859      PT LONG TERM GOAL #1   Title Independent with advanced HEP as indicated by 06/01/16   Status On-going     PT LONG TERM GOAL #2   Title Cervical ROM WFL w/o increased pain by 06/01/16   Status On-going  PT LONG TERM GOAL #3   Title Pt will report no limitation in ADLs, daily chores, driving or leisure activities (golf) due to neck or R shoulder pain by 06/01/16   Status On-going               Plan - 05/28/16 1459    Clinical Impression Statement Pt continues to experience recurrent TP's in R UT along with ttp over R lateral neck with apparent restriction of facet mobility at C3-5 causing continued pain and restricted ROM in cervical spine. Today's treatment focused on manual techniques to promote muscle relaxation and increased mobility in R lateral neck. Treatment concluded with trial of kinesiotaping to further promote relaxation of UT. Pt has a call out to MD regarding f/u with injections recommended at last MD visit.   Rehab Potential Good   PT Treatment/Interventions Patient/family education;Neuromuscular re-education;Therapeutic exercise;Therapeutic activities;Functional mobility training;Manual techniques;Dry  needling;Taping;Traction;Electrical Stimulation;Moist Heat;Cryotherapy;Ultrasound;Iontophoresis 61m/ml Dexamethasone;ADLs/Self Care Home Management   PT Next Visit Plan Assess response to taping; Continue posture and body mechanics education; Postural strengthening - scapular stabilization + anterior stretching; Manual therapy including manual traction +/- mechanical traction if continued benefit noted; Modalities PRN   Consulted and Agree with Plan of Care Patient      Patient will benefit from skilled therapeutic intervention in order to improve the following deficits and impairments:  Pain, Decreased range of motion, Impaired flexibility, Increased muscle spasms, Postural dysfunction, Improper body mechanics, Impaired sensation, Decreased activity tolerance  Visit Diagnosis: Cervicalgia  Radiculopathy, cervical region  Chronic right shoulder pain  Abnormal posture     Problem List There are no active problems to display for this patient.   JPercival Spanish PT, MPT 4April 30, 2018 4:49 PM  [facility] [place] [address]Phone: [phone]  Fax:  [phone] Name: Patrick WESSELSMRN: [id number] of Birth: [birth year]  PHYSICAL THERAPY DISCHARGE SUMMARY  Visits from Start of Care: 8  Current functional level related to goals / functional outcomes:   Unable to formally assess status as of discharge as pt cancelled all further appts while awaiting to f/u with MD. Pt has not returned in 30 days, therefore will proceed with discharge from PT for this episode.   Remaining deficits:   Refer to above clinical impression.   Education / Equipment:   HEP, pBiomedical scientisteducation   G-Codes - 0Apr 30, 2018    Functional Assessment Tool Used (Outpatient Only) Clinical judgment as pt failed to return to complete D/C FOTO   Functional Limitation Changing and maintaining body  position   Changing and Maintaining Body Position Goal Status ((Q3300 At least 20 percent but less than 40 percent impaired, limited or restricted   Changing and Maintaining Body Position Discharge Status ((T6226 At least 40 percent but less than 60 percent impaired, limited or restricted     Plan: Patient agrees to discharge.  Patient goals were partially met. Patient is being discharged due to not returning since the last visit.  ?????    JPercival Spanish PT, MPT 06/13/16, 9:14 AM  [place] [place] [address]Phone: [phone]  Fax:  [phone]

## 2016-05-16 ENCOUNTER — Ambulatory Visit: Payer: PPO

## 2016-05-21 ENCOUNTER — Ambulatory Visit: Payer: PPO | Admitting: Physical Therapy

## 2016-05-21 DIAGNOSIS — K5732 Diverticulitis of large intestine without perforation or abscess without bleeding: Secondary | ICD-10-CM | POA: Diagnosis not present

## 2016-05-21 DIAGNOSIS — R1032 Left lower quadrant pain: Secondary | ICD-10-CM | POA: Diagnosis not present

## 2016-05-23 ENCOUNTER — Ambulatory Visit: Payer: PPO

## 2016-05-23 DIAGNOSIS — M25511 Pain in right shoulder: Secondary | ICD-10-CM | POA: Diagnosis not present

## 2016-05-23 DIAGNOSIS — G8929 Other chronic pain: Secondary | ICD-10-CM | POA: Diagnosis not present

## 2016-05-23 DIAGNOSIS — M542 Cervicalgia: Secondary | ICD-10-CM | POA: Diagnosis not present

## 2016-06-22 DIAGNOSIS — G8929 Other chronic pain: Secondary | ICD-10-CM | POA: Diagnosis not present

## 2016-06-22 DIAGNOSIS — M25511 Pain in right shoulder: Secondary | ICD-10-CM | POA: Diagnosis not present

## 2016-07-11 DIAGNOSIS — M25511 Pain in right shoulder: Secondary | ICD-10-CM | POA: Diagnosis not present

## 2016-07-23 DIAGNOSIS — M25511 Pain in right shoulder: Secondary | ICD-10-CM | POA: Diagnosis not present

## 2016-07-23 DIAGNOSIS — G8929 Other chronic pain: Secondary | ICD-10-CM | POA: Diagnosis not present

## 2016-07-23 DIAGNOSIS — M542 Cervicalgia: Secondary | ICD-10-CM | POA: Diagnosis not present

## 2016-08-02 DIAGNOSIS — F339 Major depressive disorder, recurrent, unspecified: Secondary | ICD-10-CM | POA: Diagnosis not present

## 2016-08-06 DIAGNOSIS — M25511 Pain in right shoulder: Secondary | ICD-10-CM | POA: Diagnosis not present

## 2016-09-25 DIAGNOSIS — E785 Hyperlipidemia, unspecified: Secondary | ICD-10-CM | POA: Diagnosis not present

## 2016-09-25 DIAGNOSIS — F339 Major depressive disorder, recurrent, unspecified: Secondary | ICD-10-CM | POA: Diagnosis not present

## 2016-10-10 DIAGNOSIS — M542 Cervicalgia: Secondary | ICD-10-CM | POA: Diagnosis not present

## 2016-10-12 DIAGNOSIS — M542 Cervicalgia: Secondary | ICD-10-CM | POA: Diagnosis not present

## 2016-10-16 DIAGNOSIS — M542 Cervicalgia: Secondary | ICD-10-CM | POA: Diagnosis not present

## 2016-10-22 DIAGNOSIS — M542 Cervicalgia: Secondary | ICD-10-CM | POA: Diagnosis not present

## 2016-10-24 DIAGNOSIS — Z125 Encounter for screening for malignant neoplasm of prostate: Secondary | ICD-10-CM | POA: Diagnosis not present

## 2016-10-24 DIAGNOSIS — M542 Cervicalgia: Secondary | ICD-10-CM | POA: Diagnosis not present

## 2016-10-24 DIAGNOSIS — Z0000 Encounter for general adult medical examination without abnormal findings: Secondary | ICD-10-CM | POA: Diagnosis not present

## 2016-10-24 DIAGNOSIS — E785 Hyperlipidemia, unspecified: Secondary | ICD-10-CM | POA: Diagnosis not present

## 2016-10-29 DIAGNOSIS — M542 Cervicalgia: Secondary | ICD-10-CM | POA: Diagnosis not present

## 2016-10-31 DIAGNOSIS — Z6828 Body mass index (BMI) 28.0-28.9, adult: Secondary | ICD-10-CM | POA: Diagnosis not present

## 2016-10-31 DIAGNOSIS — L918 Other hypertrophic disorders of the skin: Secondary | ICD-10-CM | POA: Diagnosis not present

## 2016-10-31 DIAGNOSIS — J439 Emphysema, unspecified: Secondary | ICD-10-CM | POA: Diagnosis not present

## 2016-10-31 DIAGNOSIS — M542 Cervicalgia: Secondary | ICD-10-CM | POA: Diagnosis not present

## 2016-10-31 DIAGNOSIS — J42 Unspecified chronic bronchitis: Secondary | ICD-10-CM | POA: Diagnosis not present

## 2016-10-31 DIAGNOSIS — E559 Vitamin D deficiency, unspecified: Secondary | ICD-10-CM | POA: Diagnosis not present

## 2016-10-31 DIAGNOSIS — N400 Enlarged prostate without lower urinary tract symptoms: Secondary | ICD-10-CM | POA: Diagnosis not present

## 2016-10-31 DIAGNOSIS — E785 Hyperlipidemia, unspecified: Secondary | ICD-10-CM | POA: Diagnosis not present

## 2016-10-31 DIAGNOSIS — Z87891 Personal history of nicotine dependence: Secondary | ICD-10-CM | POA: Diagnosis not present

## 2016-10-31 DIAGNOSIS — Z0001 Encounter for general adult medical examination with abnormal findings: Secondary | ICD-10-CM | POA: Diagnosis not present

## 2016-10-31 DIAGNOSIS — B351 Tinea unguium: Secondary | ICD-10-CM | POA: Diagnosis not present

## 2016-10-31 DIAGNOSIS — Z23 Encounter for immunization: Secondary | ICD-10-CM | POA: Diagnosis not present

## 2016-11-03 DIAGNOSIS — M542 Cervicalgia: Secondary | ICD-10-CM | POA: Diagnosis not present

## 2016-11-20 DIAGNOSIS — J42 Unspecified chronic bronchitis: Secondary | ICD-10-CM | POA: Diagnosis not present

## 2016-11-20 DIAGNOSIS — F339 Major depressive disorder, recurrent, unspecified: Secondary | ICD-10-CM | POA: Diagnosis not present

## 2016-12-06 DIAGNOSIS — M5412 Radiculopathy, cervical region: Secondary | ICD-10-CM | POA: Diagnosis not present

## 2016-12-06 DIAGNOSIS — M47812 Spondylosis without myelopathy or radiculopathy, cervical region: Secondary | ICD-10-CM | POA: Diagnosis not present

## 2016-12-06 DIAGNOSIS — M542 Cervicalgia: Secondary | ICD-10-CM | POA: Diagnosis not present

## 2016-12-24 DIAGNOSIS — N529 Male erectile dysfunction, unspecified: Secondary | ICD-10-CM | POA: Diagnosis not present

## 2016-12-24 DIAGNOSIS — B351 Tinea unguium: Secondary | ICD-10-CM | POA: Diagnosis not present

## 2016-12-24 DIAGNOSIS — E785 Hyperlipidemia, unspecified: Secondary | ICD-10-CM | POA: Diagnosis not present

## 2016-12-25 DIAGNOSIS — Z6831 Body mass index (BMI) 31.0-31.9, adult: Secondary | ICD-10-CM | POA: Diagnosis not present

## 2016-12-25 DIAGNOSIS — F339 Major depressive disorder, recurrent, unspecified: Secondary | ICD-10-CM | POA: Diagnosis not present

## 2017-03-14 DIAGNOSIS — L821 Other seborrheic keratosis: Secondary | ICD-10-CM | POA: Diagnosis not present

## 2017-03-14 DIAGNOSIS — L814 Other melanin hyperpigmentation: Secondary | ICD-10-CM | POA: Diagnosis not present

## 2017-03-14 DIAGNOSIS — L738 Other specified follicular disorders: Secondary | ICD-10-CM | POA: Diagnosis not present

## 2017-03-14 DIAGNOSIS — L820 Inflamed seborrheic keratosis: Secondary | ICD-10-CM | POA: Diagnosis not present

## 2017-03-15 DIAGNOSIS — H40053 Ocular hypertension, bilateral: Secondary | ICD-10-CM | POA: Diagnosis not present

## 2017-03-15 DIAGNOSIS — H2513 Age-related nuclear cataract, bilateral: Secondary | ICD-10-CM | POA: Diagnosis not present

## 2017-05-08 DIAGNOSIS — J069 Acute upper respiratory infection, unspecified: Secondary | ICD-10-CM | POA: Diagnosis not present

## 2017-05-08 DIAGNOSIS — J40 Bronchitis, not specified as acute or chronic: Secondary | ICD-10-CM | POA: Diagnosis not present

## 2017-10-11 DIAGNOSIS — J449 Chronic obstructive pulmonary disease, unspecified: Secondary | ICD-10-CM | POA: Diagnosis not present

## 2017-10-11 DIAGNOSIS — Z87891 Personal history of nicotine dependence: Secondary | ICD-10-CM | POA: Diagnosis not present

## 2017-11-04 DIAGNOSIS — E785 Hyperlipidemia, unspecified: Secondary | ICD-10-CM | POA: Diagnosis not present

## 2017-11-04 DIAGNOSIS — E559 Vitamin D deficiency, unspecified: Secondary | ICD-10-CM | POA: Diagnosis not present

## 2017-11-05 DIAGNOSIS — Z87891 Personal history of nicotine dependence: Secondary | ICD-10-CM | POA: Diagnosis not present

## 2017-11-07 DIAGNOSIS — N529 Male erectile dysfunction, unspecified: Secondary | ICD-10-CM | POA: Diagnosis not present

## 2017-11-07 DIAGNOSIS — Z23 Encounter for immunization: Secondary | ICD-10-CM | POA: Diagnosis not present

## 2017-11-07 DIAGNOSIS — Z6830 Body mass index (BMI) 30.0-30.9, adult: Secondary | ICD-10-CM | POA: Diagnosis not present

## 2017-11-07 DIAGNOSIS — J439 Emphysema, unspecified: Secondary | ICD-10-CM | POA: Diagnosis not present

## 2017-11-07 DIAGNOSIS — F339 Major depressive disorder, recurrent, unspecified: Secondary | ICD-10-CM | POA: Diagnosis not present

## 2017-11-07 DIAGNOSIS — E785 Hyperlipidemia, unspecified: Secondary | ICD-10-CM | POA: Diagnosis not present

## 2017-11-07 DIAGNOSIS — J309 Allergic rhinitis, unspecified: Secondary | ICD-10-CM | POA: Diagnosis not present

## 2017-11-07 DIAGNOSIS — J42 Unspecified chronic bronchitis: Secondary | ICD-10-CM | POA: Diagnosis not present

## 2017-11-07 DIAGNOSIS — L570 Actinic keratosis: Secondary | ICD-10-CM | POA: Diagnosis not present

## 2017-11-07 DIAGNOSIS — E559 Vitamin D deficiency, unspecified: Secondary | ICD-10-CM | POA: Diagnosis not present

## 2017-11-07 DIAGNOSIS — Z0001 Encounter for general adult medical examination with abnormal findings: Secondary | ICD-10-CM | POA: Diagnosis not present

## 2017-11-07 DIAGNOSIS — E782 Mixed hyperlipidemia: Secondary | ICD-10-CM | POA: Diagnosis not present

## 2017-11-11 DIAGNOSIS — M25551 Pain in right hip: Secondary | ICD-10-CM | POA: Diagnosis not present

## 2017-11-28 DIAGNOSIS — M25551 Pain in right hip: Secondary | ICD-10-CM | POA: Diagnosis not present

## 2017-11-28 DIAGNOSIS — M1611 Unilateral primary osteoarthritis, right hip: Secondary | ICD-10-CM | POA: Diagnosis not present

## 2017-12-11 DIAGNOSIS — M6281 Muscle weakness (generalized): Secondary | ICD-10-CM | POA: Diagnosis not present

## 2017-12-11 DIAGNOSIS — M1611 Unilateral primary osteoarthritis, right hip: Secondary | ICD-10-CM | POA: Diagnosis not present

## 2017-12-11 DIAGNOSIS — M47812 Spondylosis without myelopathy or radiculopathy, cervical region: Secondary | ICD-10-CM | POA: Diagnosis not present

## 2017-12-11 DIAGNOSIS — M25551 Pain in right hip: Secondary | ICD-10-CM | POA: Diagnosis not present

## 2017-12-30 DIAGNOSIS — M1611 Unilateral primary osteoarthritis, right hip: Secondary | ICD-10-CM | POA: Diagnosis not present

## 2018-01-06 DIAGNOSIS — M545 Low back pain: Secondary | ICD-10-CM | POA: Diagnosis not present

## 2018-01-14 DIAGNOSIS — M545 Low back pain: Secondary | ICD-10-CM | POA: Diagnosis not present

## 2018-01-14 DIAGNOSIS — M48061 Spinal stenosis, lumbar region without neurogenic claudication: Secondary | ICD-10-CM | POA: Diagnosis not present

## 2018-02-03 DIAGNOSIS — M48061 Spinal stenosis, lumbar region without neurogenic claudication: Secondary | ICD-10-CM | POA: Diagnosis not present

## 2018-02-03 DIAGNOSIS — M545 Low back pain: Secondary | ICD-10-CM | POA: Diagnosis not present

## 2018-02-17 DIAGNOSIS — M48061 Spinal stenosis, lumbar region without neurogenic claudication: Secondary | ICD-10-CM | POA: Diagnosis not present

## 2018-02-17 DIAGNOSIS — M545 Low back pain: Secondary | ICD-10-CM | POA: Diagnosis not present

## 2018-03-03 DIAGNOSIS — M545 Low back pain: Secondary | ICD-10-CM | POA: Diagnosis not present

## 2018-03-03 DIAGNOSIS — M48061 Spinal stenosis, lumbar region without neurogenic claudication: Secondary | ICD-10-CM | POA: Diagnosis not present

## 2018-03-13 DIAGNOSIS — L918 Other hypertrophic disorders of the skin: Secondary | ICD-10-CM | POA: Diagnosis not present

## 2018-03-13 DIAGNOSIS — L738 Other specified follicular disorders: Secondary | ICD-10-CM | POA: Diagnosis not present

## 2018-03-13 DIAGNOSIS — B351 Tinea unguium: Secondary | ICD-10-CM | POA: Diagnosis not present

## 2018-03-13 DIAGNOSIS — L821 Other seborrheic keratosis: Secondary | ICD-10-CM | POA: Diagnosis not present

## 2018-03-13 DIAGNOSIS — L718 Other rosacea: Secondary | ICD-10-CM | POA: Diagnosis not present

## 2018-03-13 DIAGNOSIS — B353 Tinea pedis: Secondary | ICD-10-CM | POA: Diagnosis not present

## 2018-03-25 DIAGNOSIS — H40053 Ocular hypertension, bilateral: Secondary | ICD-10-CM | POA: Diagnosis not present

## 2018-03-25 DIAGNOSIS — H5213 Myopia, bilateral: Secondary | ICD-10-CM | POA: Diagnosis not present

## 2018-03-31 DIAGNOSIS — M545 Low back pain: Secondary | ICD-10-CM | POA: Diagnosis not present

## 2018-03-31 DIAGNOSIS — M48061 Spinal stenosis, lumbar region without neurogenic claudication: Secondary | ICD-10-CM | POA: Diagnosis not present

## 2018-04-22 DIAGNOSIS — M7130 Other bursal cyst, unspecified site: Secondary | ICD-10-CM | POA: Diagnosis not present

## 2018-04-23 ENCOUNTER — Other Ambulatory Visit: Payer: Self-pay | Admitting: Neurosurgery

## 2018-05-21 NOTE — Pre-Procedure Instructions (Signed)
Patrick Craig  05/21/2018      [id number] [place], [place] [address] Phone: [phone] Fax: [phone]    Your procedure is scheduled on Friday, May 1st.  Report to [facility] Entrance "A" Admitting at 5:30 A.M.  Call this number if you have problems the morning of surgery:  [phone]   Remember:  Do not eat or drink after midnight.     Take these medicines the morning of surgery with A SIP OF WATER   Tylenol  Atorvastatin (Lipitor)  Bupropion (Wellbutrin)  Escitalopram (Lexapro)  Advair  7 days prior to surgery STOP taking any Aspirin (unless otherwise instructed by your surgeon), Aleve, Naproxen, Ibuprofen, Motrin, Advil, Goody's, BC's, all herbal medications, fish oil, and all vitamins.     Do not wear jewelry.  Do not wear lotions, powders, colognes, or deodorant.  Men may shave face and neck.  Do not bring valuables to the hospital.  [facility] is not responsible for any belongings or valuables.   [place]- Preparing For Surgery  Before surgery, you can play an important role. Because skin is not sterile, your skin needs to be as free of germs as possible. You can reduce the number of germs on your skin by washing with CHG (chlorahexidine gluconate) Soap before surgery.  CHG is an antiseptic cleaner which kills germs and bonds with the skin to continue killing germs even after washing.    Oral Hygiene is also important to reduce your risk of infection.  Remember - BRUSH YOUR TEETH THE MORNING OF SURGERY WITH YOUR REGULAR TOOTHPASTE  Please do not use if you have an allergy to CHG or antibacterial soaps. If your skin becomes reddened/irritated stop using the CHG.  Do not shave (including legs and underarms) for at least 48 hours prior to first CHG shower. It is OK to shave your face.  Please follow these instructions carefully.   1. Shower the NIGHT BEFORE SURGERY and the MORNING OF  SURGERY with CHG.   2. If you chose to wash your hair, wash your hair first as usual with your normal shampoo.  3. After you shampoo, rinse your hair and body thoroughly to remove the shampoo.  4. Use CHG as you would any other liquid soap. You can apply CHG directly to the skin and wash gently with a scrungie or a clean washcloth.   5. Apply the CHG Soap to your body ONLY FROM THE NECK DOWN.  Do not use on open wounds or open sores. Avoid contact with your eyes, ears, mouth and genitals (private parts). Wash Face and genitals (private parts)  with your normal soap.  6. Wash thoroughly, paying special attention to the area where your surgery will be performed.  7. Thoroughly rinse your body with warm water from the neck down.  8. DO NOT shower/wash with your normal soap after using and rinsing off the CHG Soap.  9. Pat yourself dry with a CLEAN TOWEL.  10. Wear CLEAN PAJAMAS to bed the night before surgery, wear comfortable clothes the morning of surgery  11. Place CLEAN SHEETS on your bed the night of your first shower and DO NOT SLEEP WITH PETS.   Day of Surgery:  Do not apply any deodorants/lotions.  Please wear clean clothes to the hospital/surgery center.   Remember to brush your teeth WITH YOUR REGULAR TOOTHPASTE.   Contacts, dentures or bridgework may not be worn into surgery.  Leave your suitcase in the car.  After surgery it may be brought to your room.  For patients admitted to the hospital, discharge time will be determined by your treatment team.  Patients discharged the day of surgery will not be allowed to drive home.   Please read over the following fact sheets that you were given. Coughing and Deep Breathing, MRSA Information and Surgical Site Infection Prevention

## 2018-05-21 NOTE — Pre-Procedure Instructions (Signed)
    TLALOC TADDEI  05/21/2018      [id number] - [place], [place] [address] Phone: [phone] Fax: [phone]    Your procedure is scheduled on Friday, May 1st.  Report to [facility] Entrance "A" Admitting at 5:30 A.M.  Call this number if you have problems the morning of surgery:  [phone]   Remember:  Do not eat or drink after midnight.     Take these medicines the morning of surgery with A SIP OF WATER   Tylenol  Atorvastatin (Lipitor)  Bupropion (Wellbutrin)  Escitalopram (Lexapro)  Advair  7 days prior to surgery STOP taking any Aspirin (unless otherwise instructed by your surgeon), Aleve, Naproxen, Ibuprofen, Motrin, Advil, Goody's, BC's, all herbal medications, fish oil, and all vitamins.     Do not wear jewelry.  Do not wear lotions, powders, colognes, or deodorant.  Men may shave face and neck.  Do not bring valuables to the hospital.  [facility] is not responsible for any belongings or valuables.  Contacts, dentures or bridgework may not be worn into surgery.  Leave your suitcase in the car.  After surgery it may be brought to your room.  For patients admitted to the hospital, discharge time will be determined by your treatment team.  Patients discharged the day of surgery will not be allowed to drive home.   Please read over the following fact sheets that you were given. Coughing and Deep Breathing, MRSA Information and Surgical Site Infection Prevention

## 2018-05-22 ENCOUNTER — Other Ambulatory Visit: Payer: Self-pay

## 2018-05-22 ENCOUNTER — Encounter (HOSPITAL_COMMUNITY)
Admission: RE | Admit: 2018-05-22 | Discharge: 2018-05-22 | Disposition: A | Discharge status: Home / Self Care | Payer: PPO | Source: Ambulatory Visit | Attending: Neurosurgery | Admitting: Neurosurgery

## 2018-05-22 ENCOUNTER — Encounter (HOSPITAL_COMMUNITY): Payer: Self-pay

## 2018-05-22 DIAGNOSIS — Z01812 Encounter for preprocedural laboratory examination: Secondary | ICD-10-CM | POA: Diagnosis not present

## 2018-05-22 HISTORY — DX: Sleep apnea, unspecified: G47.30

## 2018-05-22 HISTORY — DX: Hyperlipidemia, unspecified: E78.5

## 2018-05-22 HISTORY — DX: Cellulitis of left upper limb: L03.114

## 2018-05-22 HISTORY — DX: Major depressive disorder, single episode, unspecified: F32.9

## 2018-05-22 HISTORY — DX: Chronic obstructive pulmonary disease, unspecified: J44.9

## 2018-05-22 HISTORY — DX: Personal history of urinary calculi: Z87.442

## 2018-05-22 HISTORY — DX: Depression, unspecified: F32.A

## 2018-05-22 HISTORY — DX: Male erectile dysfunction, unspecified: N52.9

## 2018-05-22 LAB — CBC
HCT: 44 % (ref 39.0–52.0)
Hemoglobin: 15 g/dL (ref 13.0–17.0)
MCH: 31 pg (ref 26.0–34.0)
MCHC: 34.1 g/dL (ref 30.0–36.0)
MCV: 90.9 fL (ref 80.0–100.0)
Platelets: 184 10*3/uL (ref 150–400)
RBC: 4.84 MIL/uL (ref 4.22–5.81)
RDW: 12.4 % (ref 11.5–15.5)
WBC: 5.2 10*3/uL (ref 4.0–10.5)
nRBC: 0 % (ref 0.0–0.2)

## 2018-05-22 LAB — COMPREHENSIVE METABOLIC PANEL
ALT: 38 U/L (ref 0–44)
AST: 28 U/L (ref 15–41)
Albumin: 4 g/dL (ref 3.5–5.0)
Alkaline Phosphatase: 76 U/L (ref 38–126)
Anion gap: 7 (ref 5–15)
BUN: 21 mg/dL (ref 8–23)
CO2: 25 mmol/L (ref 22–32)
Calcium: 9.2 mg/dL (ref 8.9–10.3)
Chloride: 107 mmol/L (ref 98–111)
Creatinine, Ser: 1.08 mg/dL (ref 0.61–1.24)
GFR calc Af Amer: >60 mL/min (ref >60)
GFR calc non Af Amer: >60 mL/min (ref >60)
Glucose, Bld: 104 mg/dL — ABNORMAL HIGH (ref 70–99)
Potassium: 4.4 mmol/L (ref 3.5–5.1)
Sodium: 139 mmol/L (ref 135–145)
Total Bilirubin: 1 mg/dL (ref 0.3–1.2)
Total Protein: 6.7 g/dL (ref 6.5–8.1)

## 2018-05-22 LAB — SURGICAL PCR SCREEN
MRSA, PCR: NEGATIVE
Staphylococcus aureus: POSITIVE — AB

## 2018-05-22 NOTE — Progress Notes (Signed)
PCP - Dr. Deland Pretty Cardiologist - denies Pulmonary- Wilber Bihari  Chest x-ray - N/A EKG - 10/2017 - requested Stress Test - denies ECHO - denies Cardiac Cath - denies  Sleep Study - 5-6 years ago- records requested CPAP - does not use   Aspirin Instructions: Patient instructed to hold all Aspirin, NSAID's, herbal medications, fish oil and vitamins 7 days prior to surgery.  Patient aware following surgery he will need to be in the care of another adult for 24 hours, cannot drive, operate heavy machinery, drink ETOH, or make any legal decisions.  Anesthesia review:   Patient denies shortness of breath, fever, cough and chest pain at PAT appointment   Patient verbalized understanding of instructions that were given to them at the PAT appointment. Patient was also instructed that they will need to review over the PAT instructions again at home before surgery.

## 2018-05-22 NOTE — Progress Notes (Signed)
PCR + for MSSA. Rx for Mupirocin [facility] in [facility]. Patient aware.

## 2018-05-30 ENCOUNTER — Encounter (HOSPITAL_COMMUNITY): Payer: Self-pay | Admitting: Certified Registered Nurse Anesthetist

## 2018-05-30 ENCOUNTER — Ambulatory Visit (HOSPITAL_COMMUNITY): Payer: PPO | Admitting: Physician Assistant

## 2018-05-30 ENCOUNTER — Ambulatory Visit (HOSPITAL_COMMUNITY): Payer: PPO

## 2018-05-30 ENCOUNTER — Other Ambulatory Visit: Payer: Self-pay

## 2018-05-30 ENCOUNTER — Encounter (HOSPITAL_COMMUNITY)
Admission: RE | Disposition: A | Discharge status: Home / Self Care | Payer: Self-pay | Source: Home / Self Care | Attending: Neurosurgery

## 2018-05-30 ENCOUNTER — Ambulatory Visit (HOSPITAL_COMMUNITY): Payer: PPO | Admitting: Certified Registered Nurse Anesthetist

## 2018-05-30 ENCOUNTER — Ambulatory Visit (HOSPITAL_COMMUNITY)
Admission: RE | Admit: 2018-05-30 | Discharge: 2018-05-30 | Disposition: A | Discharge status: Home / Self Care | Payer: PPO | Attending: Neurosurgery | Admitting: Neurosurgery

## 2018-05-30 DIAGNOSIS — Z79899 Other long term (current) drug therapy: Secondary | ICD-10-CM | POA: Diagnosis not present

## 2018-05-30 DIAGNOSIS — Z981 Arthrodesis status: Secondary | ICD-10-CM | POA: Diagnosis not present

## 2018-05-30 DIAGNOSIS — E785 Hyperlipidemia, unspecified: Secondary | ICD-10-CM | POA: Insufficient documentation

## 2018-05-30 DIAGNOSIS — F329 Major depressive disorder, single episode, unspecified: Secondary | ICD-10-CM | POA: Insufficient documentation

## 2018-05-30 DIAGNOSIS — J449 Chronic obstructive pulmonary disease, unspecified: Secondary | ICD-10-CM | POA: Diagnosis not present

## 2018-05-30 DIAGNOSIS — M7130 Other bursal cyst, unspecified site: Secondary | ICD-10-CM | POA: Diagnosis present

## 2018-05-30 DIAGNOSIS — M5416 Radiculopathy, lumbar region: Secondary | ICD-10-CM | POA: Insufficient documentation

## 2018-05-30 DIAGNOSIS — M7138 Other bursal cyst, other site: Secondary | ICD-10-CM | POA: Diagnosis not present

## 2018-05-30 DIAGNOSIS — G4730 Sleep apnea, unspecified: Secondary | ICD-10-CM | POA: Diagnosis not present

## 2018-05-30 DIAGNOSIS — Z881 Allergy status to other antibiotic agents status: Secondary | ICD-10-CM | POA: Insufficient documentation

## 2018-05-30 DIAGNOSIS — Z87891 Personal history of nicotine dependence: Secondary | ICD-10-CM | POA: Insufficient documentation

## 2018-05-30 DIAGNOSIS — Z7951 Long term (current) use of inhaled steroids: Secondary | ICD-10-CM | POA: Insufficient documentation

## 2018-05-30 DIAGNOSIS — Z419 Encounter for procedure for purposes other than remedying health state, unspecified: Secondary | ICD-10-CM

## 2018-05-30 DIAGNOSIS — Z791 Long term (current) use of non-steroidal anti-inflammatories (NSAID): Secondary | ICD-10-CM | POA: Insufficient documentation

## 2018-05-30 DIAGNOSIS — N529 Male erectile dysfunction, unspecified: Secondary | ICD-10-CM | POA: Insufficient documentation

## 2018-05-30 HISTORY — PX: LUMBAR LAMINECTOMY/DECOMPRESSION MICRODISCECTOMY: SHX5026

## 2018-05-30 SURGERY — LUMBAR LAMINECTOMY/DECOMPRESSION MICRODISCECTOMY 1 LEVEL
Anesthesia: General | Site: Spine Lumbar | Laterality: Right

## 2018-05-30 MED ORDER — ACETAMINOPHEN 500 MG PO TABS: 1000.0000 mg | ORAL_TABLET | Freq: Four times a day (QID) | ORAL | Status: DC | PRN

## 2018-05-30 MED ORDER — FENTANYL CITRATE (PF) 250 MCG/5ML IJ SOLN
INTRAMUSCULAR | Status: AC
  Filled 2018-05-30: qty 5

## 2018-05-30 MED ORDER — ROCURONIUM BROMIDE 100 MG/10ML IV SOLN
INTRAVENOUS | Status: DC | PRN
  Administered 2018-05-30: 50 mg via INTRAVENOUS

## 2018-05-30 MED ORDER — BUPIVACAINE HCL (PF) 0.25 % IJ SOLN
INTRAMUSCULAR | Status: AC
  Filled 2018-05-30: qty 30

## 2018-05-30 MED ORDER — THROMBIN 5000 UNITS EX SOLR
CUTANEOUS | Status: DC | PRN
  Administered 2018-05-30 (×2): 5000 [IU] via TOPICAL

## 2018-05-30 MED ORDER — PROPOFOL 10 MG/ML IV BOLUS
INTRAVENOUS | Status: AC
  Filled 2018-05-30: qty 20

## 2018-05-30 MED ORDER — DEXAMETHASONE SODIUM PHOSPHATE 10 MG/ML IJ SOLN
INTRAMUSCULAR | Status: AC
  Filled 2018-05-30: qty 1

## 2018-05-30 MED ORDER — CYCLOBENZAPRINE HCL 10 MG PO TABS: 10.0000 mg | ORAL_TABLET | Freq: Three times a day (TID) | ORAL | Status: DC | PRN

## 2018-05-30 MED ORDER — PROPOFOL 10 MG/ML IV BOLUS
INTRAVENOUS | Status: DC | PRN
  Administered 2018-05-30: 200 mg via INTRAVENOUS

## 2018-05-30 MED ORDER — SODIUM CHLORIDE 0.9% FLUSH: 3.0000 mL | INTRAVENOUS | Status: DC | PRN

## 2018-05-30 MED ORDER — SODIUM CHLORIDE 0.9% FLUSH: 3.0000 mL | Freq: Two times a day (BID) | INTRAVENOUS | Status: DC

## 2018-05-30 MED ORDER — DIPHENHYDRAMINE HCL 25 MG PO TABS: 12.5000 mg | ORAL_TABLET | Freq: Every day | ORAL | Status: DC

## 2018-05-30 MED ORDER — MIDAZOLAM HCL 5 MG/5ML IJ SOLN
INTRAMUSCULAR | Status: DC | PRN
  Administered 2018-05-30: 2 mg via INTRAVENOUS

## 2018-05-30 MED ORDER — THROMBIN 5000 UNITS EX SOLR
CUTANEOUS | Status: AC
  Filled 2018-05-30: qty 10000

## 2018-05-30 MED ORDER — DIPHENHYDRAMINE HCL 25 MG PO CAPS: 25.0000 mg | ORAL_CAPSULE | Freq: Every day | ORAL | Status: DC

## 2018-05-30 MED ORDER — PHENOL 1.4 % MT LIQD: 1.0000 | OROMUCOSAL | Status: DC | PRN

## 2018-05-30 MED ORDER — CEFAZOLIN SODIUM-DEXTROSE 2-4 GM/100ML-% IV SOLN
2.0000 g | Freq: Three times a day (TID) | INTRAVENOUS | Status: DC
  Filled 2018-05-30: qty 100

## 2018-05-30 MED ORDER — MENTHOL 3 MG MT LOZG: 1.0000 | LOZENGE | OROMUCOSAL | Status: DC | PRN

## 2018-05-30 MED ORDER — MOMETASONE FURO-FORMOTEROL FUM 200-5 MCG/ACT IN AERO
2.0000 | INHALATION_SPRAY | Freq: Two times a day (BID) | RESPIRATORY_TRACT | Status: DC
  Filled 2018-05-30: qty 8.8

## 2018-05-30 MED ORDER — CHLORHEXIDINE GLUCONATE CLOTH 2 % EX PADS: 6.0000 | MEDICATED_PAD | Freq: Once | CUTANEOUS | Status: DC

## 2018-05-30 MED ORDER — BUPROPION HCL ER (XL) 150 MG PO TB24: 300.0000 mg | ORAL_TABLET | ORAL | Status: DC

## 2018-05-30 MED ORDER — ONDANSETRON HCL 4 MG/2ML IJ SOLN: 4.0000 mg | Freq: Four times a day (QID) | INTRAMUSCULAR | Status: DC | PRN

## 2018-05-30 MED ORDER — DEXAMETHASONE SODIUM PHOSPHATE 10 MG/ML IJ SOLN
10.0000 mg | INTRAMUSCULAR | Status: AC
  Administered 2018-05-30: 10 mg via INTRAVENOUS

## 2018-05-30 MED ORDER — HYDROMORPHONE HCL 1 MG/ML IJ SOLN: 0.2500 mg | INTRAMUSCULAR | Status: DC | PRN

## 2018-05-30 MED ORDER — HYDROMORPHONE HCL 1 MG/ML IJ SOLN: 1.0000 mg | INTRAMUSCULAR | Status: DC | PRN

## 2018-05-30 MED ORDER — ACETAMINOPHEN 500 MG PO TABS: 1000.0000 mg | ORAL_TABLET | Freq: Once | ORAL | Status: DC

## 2018-05-30 MED ORDER — ALUM & MAG HYDROXIDE-SIMETH 200-200-20 MG/5ML PO SUSP: 30.0000 mL | Freq: Four times a day (QID) | ORAL | Status: DC | PRN

## 2018-05-30 MED ORDER — PHENYLEPHRINE HCL (PRESSORS) 10 MG/ML IV SOLN
INTRAVENOUS | Status: DC | PRN
  Administered 2018-05-30: 80 ug via INTRAVENOUS

## 2018-05-30 MED ORDER — GUAIFENESIN ER 600 MG PO TB12: 600.0000 mg | ORAL_TABLET | Freq: Every day | ORAL | Status: DC | PRN

## 2018-05-30 MED ORDER — ACETAMINOPHEN 325 MG PO TABS: 650.0000 mg | ORAL_TABLET | ORAL | Status: DC | PRN

## 2018-05-30 MED ORDER — OXYCODONE HCL 5 MG PO TABS
5.0000 mg | ORAL_TABLET | ORAL | Status: DC | PRN
  Administered 2018-05-30: 5 mg via ORAL
  Filled 2018-05-30: qty 1

## 2018-05-30 MED ORDER — DIPHENHYDRAMINE HCL 12.5 MG/5ML PO ELIX: 12.5000 mg | ORAL_SOLUTION | Freq: Every day | ORAL | Status: DC

## 2018-05-30 MED ORDER — EPHEDRINE SULFATE 50 MG/ML IJ SOLN
INTRAMUSCULAR | Status: DC | PRN
  Administered 2018-05-30: 5 mg via INTRAVENOUS
  Administered 2018-05-30 (×2): 10 mg via INTRAVENOUS
  Administered 2018-05-30: 5 mg via INTRAVENOUS

## 2018-05-30 MED ORDER — SODIUM CHLORIDE 0.9 % IV SOLN: 250.0000 mL | INTRAVENOUS | Status: DC

## 2018-05-30 MED ORDER — HEMOSTATIC AGENTS (NO CHARGE) OPTIME
TOPICAL | Status: DC | PRN
  Administered 2018-05-30: 1 via TOPICAL

## 2018-05-30 MED ORDER — MELOXICAM 7.5 MG PO TABS
15.0000 mg | ORAL_TABLET | Freq: Every day | ORAL | Status: DC
  Filled 2018-05-30: qty 2

## 2018-05-30 MED ORDER — SUGAMMADEX SODIUM 200 MG/2ML IV SOLN
INTRAVENOUS | Status: DC | PRN
  Administered 2018-05-30: 300 mg via INTRAVENOUS

## 2018-05-30 MED ORDER — MIDAZOLAM HCL 2 MG/2ML IJ SOLN
INTRAMUSCULAR | Status: AC
  Filled 2018-05-30: qty 2

## 2018-05-30 MED ORDER — LIDOCAINE HCL (CARDIAC) PF 100 MG/5ML IV SOSY
PREFILLED_SYRINGE | INTRAVENOUS | Status: DC | PRN
  Administered 2018-05-30: 60 mg via INTRAVENOUS

## 2018-05-30 MED ORDER — CEFAZOLIN SODIUM-DEXTROSE 2-4 GM/100ML-% IV SOLN
INTRAVENOUS | Status: AC
  Filled 2018-05-30: qty 100

## 2018-05-30 MED ORDER — PANTOPRAZOLE SODIUM 40 MG IV SOLR: 40.0000 mg | Freq: Every day | INTRAVENOUS | Status: DC

## 2018-05-30 MED ORDER — LIDOCAINE-EPINEPHRINE 1 %-1:100000 IJ SOLN
INTRAMUSCULAR | Status: AC
  Filled 2018-05-30: qty 1

## 2018-05-30 MED ORDER — ONDANSETRON HCL 4 MG PO TABS: 4.0000 mg | ORAL_TABLET | Freq: Four times a day (QID) | ORAL | Status: DC | PRN

## 2018-05-30 MED ORDER — LIDOCAINE-EPINEPHRINE 1 %-1:100000 IJ SOLN
INTRAMUSCULAR | Status: DC | PRN
  Administered 2018-05-30: 10 mL

## 2018-05-30 MED ORDER — BUPIVACAINE HCL (PF) 0.25 % IJ SOLN
INTRAMUSCULAR | Status: DC | PRN
  Administered 2018-05-30: 10 mL

## 2018-05-30 MED ORDER — LACTATED RINGERS IV SOLN
INTRAVENOUS | Status: DC | PRN
  Administered 2018-05-30 (×2): via INTRAVENOUS

## 2018-05-30 MED ORDER — ESCITALOPRAM OXALATE 10 MG PO TABS
10.0000 mg | ORAL_TABLET | ORAL | Status: DC
  Filled 2018-05-30: qty 1

## 2018-05-30 MED ORDER — SODIUM CHLORIDE 0.9 % IV SOLN
INTRAVENOUS | Status: DC | PRN
  Administered 2018-05-30: 08:00:00 500 mL

## 2018-05-30 MED ORDER — ATORVASTATIN CALCIUM 10 MG PO TABS: 20.0000 mg | ORAL_TABLET | Freq: Every day | ORAL | Status: DC

## 2018-05-30 MED ORDER — CEFAZOLIN SODIUM-DEXTROSE 2-4 GM/100ML-% IV SOLN
2.0000 g | INTRAVENOUS | Status: AC
  Administered 2018-05-30: 2 g via INTRAVENOUS

## 2018-05-30 MED ORDER — ACETAMINOPHEN 650 MG RE SUPP: 650.0000 mg | RECTAL | Status: DC | PRN

## 2018-05-30 MED ORDER — FENTANYL CITRATE (PF) 100 MCG/2ML IJ SOLN
INTRAMUSCULAR | Status: DC | PRN
  Administered 2018-05-30 (×2): 25 ug via INTRAVENOUS
  Administered 2018-05-30: 50 ug via INTRAVENOUS
  Administered 2018-05-30: 100 ug via INTRAVENOUS
  Administered 2018-05-30: 50 ug via INTRAVENOUS

## 2018-05-30 MED ORDER — OXYCODONE-ACETAMINOPHEN 5-325 MG PO TABS: 1.0000 | ORAL_TABLET | ORAL | 0 refills | Status: DC | PRN

## 2018-05-30 MED ORDER — 0.9 % SODIUM CHLORIDE (POUR BTL) OPTIME
TOPICAL | Status: DC | PRN
  Administered 2018-05-30: 08:00:00 1000 mL

## 2018-05-30 MED ORDER — GLYCOPYRROLATE 0.2 MG/ML IJ SOLN
INTRAMUSCULAR | Status: DC | PRN
  Administered 2018-05-30: 0.1 mg via INTRAVENOUS

## 2018-05-30 MED ORDER — ONDANSETRON HCL 4 MG/2ML IJ SOLN
INTRAMUSCULAR | Status: DC | PRN
  Administered 2018-05-30: 4 mg via INTRAVENOUS

## 2018-05-30 SURGICAL SUPPLY — 60 items
ADH SKN CLS APL DERMABOND .7 (GAUZE/BANDAGES/DRESSINGS) ×1
APL SKNCLS STERI-STRIP NONHPOA (GAUZE/BANDAGES/DRESSINGS) ×1
BAG DECANTER FOR FLEXI CONT (MISCELLANEOUS) ×3 IMPLANT
BENZOIN TINCTURE PRP APPL 2/3 (GAUZE/BANDAGES/DRESSINGS) ×3 IMPLANT
BLADE CLIPPER SURG (BLADE) ×2 IMPLANT
BLADE SURG 11 STRL SS (BLADE) ×3 IMPLANT
BUR CUTTER 7.0 ROUND (BURR) ×3 IMPLANT
BUR MATCHSTICK NEURO 3.0 LAGG (BURR) ×3 IMPLANT
CANISTER SUCT 3000ML PPV (MISCELLANEOUS) ×3 IMPLANT
CARTRIDGE OIL MAESTRO DRILL (MISCELLANEOUS) ×1 IMPLANT
CLOSURE WOUND 1/2 X4 (GAUZE/BANDAGES/DRESSINGS) ×1
COVER WAND RF STERILE (DRAPES) ×1 IMPLANT
DECANTER SPIKE VIAL GLASS SM (MISCELLANEOUS) ×5 IMPLANT
DERMABOND ADVANCED (GAUZE/BANDAGES/DRESSINGS) ×2
DERMABOND ADVANCED .7 DNX12 (GAUZE/BANDAGES/DRESSINGS) ×1 IMPLANT
DIFFUSER DRILL AIR PNEUMATIC (MISCELLANEOUS) ×3 IMPLANT
DRAPE HALF SHEET 40X57 (DRAPES) ×2 IMPLANT
DRAPE LAPAROTOMY 100X72X124 (DRAPES) ×3 IMPLANT
DRAPE MICROSCOPE LEICA (MISCELLANEOUS) ×3 IMPLANT
DRAPE SURG 17X23 STRL (DRAPES) ×3 IMPLANT
DRSG OPSITE POSTOP 3X4 (GAUZE/BANDAGES/DRESSINGS) IMPLANT
DRSG OPSITE POSTOP 4X6 (GAUZE/BANDAGES/DRESSINGS) ×2 IMPLANT
DURAPREP 26ML APPLICATOR (WOUND CARE) ×3 IMPLANT
ELECT REM PT RETURN 9FT ADLT (ELECTROSURGICAL) ×3
ELECTRODE REM PT RTRN 9FT ADLT (ELECTROSURGICAL) ×1 IMPLANT
GAUZE 4X4 16PLY RFD (DISPOSABLE) IMPLANT
GAUZE SPONGE 4X4 12PLY STRL (GAUZE/BANDAGES/DRESSINGS) ×1 IMPLANT
GLOVE BIO SURGEON STRL SZ 6.5 (GLOVE) ×3 IMPLANT
GLOVE BIO SURGEON STRL SZ7 (GLOVE) ×2 IMPLANT
GLOVE BIO SURGEON STRL SZ8 (GLOVE) ×3 IMPLANT
GLOVE BIO SURGEONS STRL SZ 6.5 (GLOVE) ×3
GLOVE BIOGEL PI IND STRL 6.5 (GLOVE) IMPLANT
GLOVE BIOGEL PI IND STRL 7.0 (GLOVE) IMPLANT
GLOVE BIOGEL PI INDICATOR 6.5 (GLOVE) ×4
GLOVE BIOGEL PI INDICATOR 7.0 (GLOVE) ×4
GLOVE EXAM NITRILE XL STR (GLOVE) IMPLANT
GLOVE INDICATOR 8.5 STRL (GLOVE) ×3 IMPLANT
GOWN STRL REUS W/ TWL LRG LVL3 (GOWN DISPOSABLE) ×1 IMPLANT
GOWN STRL REUS W/ TWL XL LVL3 (GOWN DISPOSABLE) ×2 IMPLANT
GOWN STRL REUS W/TWL 2XL LVL3 (GOWN DISPOSABLE) IMPLANT
GOWN STRL REUS W/TWL LRG LVL3 (GOWN DISPOSABLE) ×9
GOWN STRL REUS W/TWL XL LVL3 (GOWN DISPOSABLE) ×3
KIT BASIN OR (CUSTOM PROCEDURE TRAY) ×3 IMPLANT
KIT TURNOVER KIT B (KITS) ×3 IMPLANT
NDL SPNL 22GX3.5 QUINCKE BK (NEEDLE) ×1 IMPLANT
NEEDLE HYPO 22GX1.5 SAFETY (NEEDLE) ×3 IMPLANT
NEEDLE SPNL 22GX3.5 QUINCKE BK (NEEDLE) ×3 IMPLANT
NS IRRIG 1000ML POUR BTL (IV SOLUTION) ×3 IMPLANT
OIL CARTRIDGE MAESTRO DRILL (MISCELLANEOUS) ×3
PACK LAMINECTOMY NEURO (CUSTOM PROCEDURE TRAY) ×3 IMPLANT
RUBBERBAND STERILE (MISCELLANEOUS) ×6 IMPLANT
SPONGE SURGIFOAM ABS GEL SZ50 (HEMOSTASIS) ×3 IMPLANT
STRIP CLOSURE SKIN 1/2X4 (GAUZE/BANDAGES/DRESSINGS) ×2 IMPLANT
SUT VIC AB 0 CT1 18XCR BRD8 (SUTURE) ×1 IMPLANT
SUT VIC AB 0 CT1 8-18 (SUTURE) ×3
SUT VIC AB 2-0 CT1 18 (SUTURE) ×3 IMPLANT
SUT VICRYL 4-0 PS2 18IN ABS (SUTURE) ×3 IMPLANT
TOWEL GREEN STERILE (TOWEL DISPOSABLE) ×3 IMPLANT
TOWEL GREEN STERILE FF (TOWEL DISPOSABLE) ×3 IMPLANT
WATER STERILE IRR 1000ML POUR (IV SOLUTION) ×3 IMPLANT

## 2018-05-30 NOTE — Anesthesia Procedure Notes (Signed)
Procedure Name: Intubation Date/Time: 05/30/2018 7:38 AM Performed by: Tyianna Menefee T, CRNA Pre-anesthesia Checklist: Patient identified, Emergency Drugs available, Suction available and Patient being monitored Patient Re-evaluated:Patient Re-evaluated prior to induction Oxygen Delivery Method: Circle system utilized Preoxygenation: Pre-oxygenation with 100% oxygen Induction Type: IV induction, Rapid sequence and Cricoid Pressure applied Laryngoscope Size: Miller and 3 Grade View: Grade I Tube type: Oral Tube size: 7.5 mm Number of attempts: 1 Airway Equipment and Method: Patient positioned with wedge pillow and Stylet Placement Confirmation: ETT inserted through vocal cords under direct vision,  positive ETCO2 and breath sounds checked- equal and bilateral Secured at: 23 cm Tube secured with: Tape Dental Injury: Teeth and Oropharynx as per pre-operative assessment

## 2018-05-30 NOTE — Op Note (Signed)
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  Work-up revealed a large synovial cyst causing severe thecal sac compression at L3-4 on the right due to patient's progression of clinical syndrome imaging findings failed conservative treatment I recommended decompressive laminectomy at L3-4 on the right.  I extensively went over the risks and benefits of the operation with him as well as perioperative course expectations of outcome and alternatives of surgery and he understood and agreed to proceed forward.  Operative procedure: Patient was brought into the OR was used under general anesthesia positioned prone Wilson frame his back was prepped and draped in routine sterile fashion preoperative x-ray localized the appropriate level so after infiltration of 10 cc lidocaine with epi midline incision was made and Bovie electrocautery was used to take down the subcutaneous tissue and subperiosteal dissection was carried lamina of L3-4 on the right.  Intraoperative x-ray confirmed identification appropriate level.  So utilizing a high-speed drill the inferior aspect of lamina of L3 approximately the inferior 50% of it as well as medial facet complex superior aspect of lamina of L4 was drilled down laminotomy was begun with a 3 mm Kerrison punch ligament flow was identified removed in piecemeal fashion thecal sac was then immediately identified.  Then under microscopic  illumination marched up the thecal sac both superiorly medially and laterally to identify the cyst which was presenting in the lateral aspect of the canal and work around the cyst membrane.  Using a 4 Penfield I dissected the cyst membrane off of the lateral wall of the thecal sac.  Then under biting the medial gutter and removing portion of the wall of the cyst was full of degenerative contents none of it was clear fluid but all the granulomatous material within the cyst was teased off of the dura and removed.  Aggressively under biting the undersurface of the medial gutter removing the medial cyst wall and removal of the wall from the lateral dura felt like I got a pretty good resection of the entire wall of the cyst.  At the end of the resection there is no further stenosis on thecal sac but this space was not bulging the foramina was widely patent easily excepting a coronary dilator.  Wounds then copiously irrigated to Kassim states was maintained Gelfoam was opened up the dura the muscle fascia approximate layers with Vicryl skin was closed running 4 subcuticular Dermabond benzoin Steri-Strips and a sterile dressing was applied patient recovery in stable condition.  At the end the case all needle, sponge counts were correct.

## 2018-05-30 NOTE — Transfer of Care (Signed)
Immediate Anesthesia Transfer of Care Note  Patient: Patrick Craig  Procedure(s) Performed: Right Lumbar Three-Four Laminectomy for synovial cyst (Right Spine Lumbar)  Patient Location: PACU  Anesthesia Type:General  Level of Consciousness: drowsy  Airway & Oxygen Therapy: Patient Spontanous Breathing and Patient connected to nasal cannula oxygen  Post-op Assessment: Report given to RN, Post -op Vital signs reviewed and stable and Patient moving all extremities  Post vital signs: Reviewed and stable  Last Vitals:  Vitals Value Taken Time  BP 136/85 05/30/2018  9:33 AM  Temp    Pulse 95 05/30/2018  9:34 AM  Resp 14 05/30/2018  9:34 AM  SpO2 96 % 05/30/2018  9:34 AM  Vitals shown include unvalidated device data.  Last Pain:  Vitals:   05/30/18 0617  TempSrc:   PainSc: 3          Complications: No apparent anesthesia complications

## 2018-05-30 NOTE — Anesthesia Preprocedure Evaluation (Addendum)
Anesthesia Evaluation  Patient identified by MRN, date of birth, ID band Patient awake    Reviewed: Allergy & Precautions, H&P , NPO status , Patient's Chart, lab work & pertinent test results  Airway Mallampati: II  TM Distance: >3 FB Neck ROM: Full    Dental no notable dental hx. (+) Teeth Intact, Dental Advisory Given   Pulmonary COPD,  COPD inhaler, former smoker,    Pulmonary exam normal breath sounds clear to auscultation       Cardiovascular negative cardio ROS   Rhythm:Regular Rate:Normal     Neuro/Psych Depression negative neurological ROS     GI/Hepatic negative GI ROS, Neg liver ROS,   Endo/Other  negative endocrine ROS  Renal/GU negative Renal ROS  negative genitourinary   Musculoskeletal   Abdominal   Peds  Hematology negative hematology ROS (+)   Anesthesia Other Findings   Reproductive/Obstetrics negative OB ROS                            Anesthesia Physical Anesthesia Plan  ASA: III  Anesthesia Plan: General   Post-op Pain Management:    Induction: Intravenous  PONV Risk Score and Plan: 3 and Ondansetron, Dexamethasone and Midazolam  Airway Management Planned: Oral ETT  Additional Equipment:   Intra-op Plan:   Post-operative Plan: Extubation in OR  Informed Consent: I have reviewed the patients History and Physical, chart, labs and discussed the procedure including the risks, benefits and alternatives for the proposed anesthesia with the patient or authorized representative who has indicated his/her understanding and acceptance.     Dental advisory given  Plan Discussed with: CRNA  Anesthesia Plan Comments:         Anesthesia Quick Evaluation

## 2018-05-30 NOTE — Progress Notes (Signed)
Pt being discharged from hospital per orders from MD. Pt educated on discharge instructions. Pt verbalized understanding of instructions. All questions and concerns were addressed. Pt's IV was removed prior to discharge. Pt exited hospital via wheelchair accompanied by staff. 

## 2018-05-30 NOTE — H&P (Signed)
Patrick Craig is an 68 y.o. male.   Chief Complaint: Back and right hip and leg pain HPI: 68 year old gentleman has had progressive worsening back and right hip and leg pain with severe pain rating down an L4 nerve root pattern is got some weakness in his quadricep skin progressive worse this could become progressively more disabled.  Due to his progression of clinical syndrome imaging findings and failed conservative treatment I recommended laminectomy for resection of synovial cyst on the right at L3-4.  I extensively gone over the risks and benefits of the operation with him as well as perioperative course expectations of outcome and alternatives of surgery and he understands and agrees to proceed forward.  Past Medical History:  Diagnosis Date  . Cellulitis of arm, left 08/06/2014  . COPD (chronic obstructive pulmonary disease) ([place])   . Depression   . Erectile dysfunction   . History of kidney stones    "30 years ago"  . Hyperlipemia   . Sleep apnea     Past Surgical History:  Procedure Laterality Date  . APPENDECTOMY  1983  . HERNIA REPAIR Left 2000   inguinal  . INCISION AND DRAINAGE ABSCESS    . KNEE ARTHROSCOPY Left   . SHOULDER SURGERY Bilateral     History reviewed. No pertinent family history. Social History:  reports that he quit smoking about 10 years ago. His smoking use included cigarettes. He has never used smokeless tobacco. He reports current alcohol use. He reports that he does not use drugs.  Allergies:  Allergies  Allergen Reactions  . Erythromycin Base Other (See Comments)    GI Upset (intolerance)  . Penicillins Other (See Comments)    Unknown reaction, was told as a child Did it involve swelling of the face/tongue/throat, SOB, or low BP? Unknown Did it involve sudden or severe rash/hives, skin peeling, or any reaction on the inside of your mouth or nose? Unknown Did you need to seek medical attention at a hospital or doctor's office? Unknown When  did it last happen?Childhood If all above answers are "NO", may proceed with cephalosporin use.      Medications Prior to Admission  Medication Sig Dispense Refill  . acetaminophen (TYLENOL) 500 MG tablet Take 1,000 mg by mouth every 6 (six) hours as needed for moderate pain.    Marland Kitchen atorvastatin (LIPITOR) 20 MG tablet Take 20 mg by mouth daily.    Marland Kitchen buPROPion (WELLBUTRIN XL) 300 MG 24 hr tablet Take 300 mg by mouth every other day.    . cholecalciferol (VITAMIN D3) 25 MCG (1000 UT) tablet Take 1,000 Units by mouth.     . cyclobenzaprine (FLEXERIL) 10 MG tablet Take 10 mg by mouth 3 (three) times daily as needed for muscle spasms.     . diphenhydrAMINE (BENADRYL) 25 MG tablet Take 12.5-25 mg by mouth at bedtime.    Marland Kitchen escitalopram (LEXAPRO) 10 MG tablet Take 10 mg by mouth every other day.     . Fluticasone-Salmeterol (ADVAIR) 250-50 MCG/DOSE AEPB Inhale 1 puff into the lungs daily.     Marland Kitchen guaiFENesin (MUCINEX) 600 MG 12 hr tablet Take 600 mg by mouth daily as needed for cough.    . meloxicam (MOBIC) 15 MG tablet Take 15 mg by mouth daily.    . sildenafil (REVATIO) 20 MG tablet Take 20 mg by mouth daily as needed.      No results found for this or any previous visit (from the past 48 hour(s)). No results found.  Review of Systems  Musculoskeletal: Positive for back pain and joint pain.  Neurological: Positive for tingling and sensory change.    Blood pressure (!) 148/94, pulse 68, temperature 98 F (36.7 C), temperature source Oral, resp. rate 18, height 6' (1.829 m), weight 104.3 kg, SpO2 100 %. Physical Exam  Constitutional: He is oriented to person, place, and time. He appears well-developed and well-nourished.  HENT:  Head: Normocephalic.  Eyes: Pupils are equal, round, and reactive to light.  Neck: Normal range of motion.  GI: Soft.  Neurological: He is alert and oriented to person, place, and time. He has normal strength. GCS eye subscore is 4. GCS verbal subscore is 5.  GCS motor subscore is 6.  Strength is 5 out of 5 iliopsoas, quads, hamstrings, gastroc, and tibialis and EHL on the left he has some trace quadricep weakness 4+ out of 5 on the right dorsiflexion appears to be 5 out of 5  Skin: Skin is warm and dry.     Assessment/Plan 68 year old presents for right-sided laminectomy for resection of synovial cyst.  Lekeya Rollings P, MD 05/30/2018, 7:24 AM

## 2018-05-30 NOTE — Discharge Summary (Signed)
  Physician Discharge Summary  Patient ID: Patrick Craig MRN: [id number] DOB/AGE: 1950-08-05 68 y.o. Estimated body mass index is 31.19 kg/m as calculated from the following:   Height as of this encounter: 6' (1.829 m).   Weight as of this encounter: 104.3 kg.   Admit date: 05/30/2018 Discharge date: 05/30/2018  Admission Diagnoses: Sided synovial cyst L3-4  Discharge Diagnoses: Same Active Problems:   Synovial cyst   Discharged Condition: good  Hospital Course: Patient is admitted to hospital underwent decompressive laminectomy L3-4 on the right for resection of right-sided synovial cyst.  Postoperatively patient was was doing very well was ambulating and voiding complete resolution of preoperative radicular symptoms and stable for discharge home.  Will be discharged scheduled follow-up in 1 to 2 weeks.  Consults: Significant Diagnostic Studies: Treatments: Decompressive laminectomy L3-4 for resection of right-sided synovial cyst Discharge Exam: Blood pressure 125/79, pulse 78, temperature 98.7 F (37.1 C), temperature source Oral, resp. rate 15, height 6' (1.829 m), weight 104.3 kg, SpO2 98 %. Strength 5 out of 5 wound clean dry and intact  Disposition: Home   Allergies as of 05/30/2018      Reactions   Erythromycin Base Other (See Comments)   GI Upset (intolerance)   Penicillins Other (See Comments)   Unknown reaction, was told as a child Did it involve swelling of the face/tongue/throat, SOB, or low BP? Unknown Did it involve sudden or severe rash/hives, skin peeling, or any reaction on the inside of your mouth or nose? Unknown Did you need to seek medical attention at a hospital or doctor's office? Unknown When did it last happen?Childhood If all above answers are "NO", may proceed with cephalosporin use.      Medication List    TAKE these medications   acetaminophen 500 MG tablet Commonly known as:  TYLENOL Take 1,000 mg by mouth every 6 (six) hours as  needed for moderate pain.   atorvastatin 20 MG tablet Commonly known as:  LIPITOR Take 20 mg by mouth daily.   buPROPion 300 MG 24 hr tablet Commonly known as:  WELLBUTRIN XL Take 300 mg by mouth every other day.   cholecalciferol 25 MCG (1000 UT) tablet Commonly known as:  VITAMIN D3 Take 1,000 Units by mouth.   cyclobenzaprine 10 MG tablet Commonly known as:  FLEXERIL Take 10 mg by mouth 3 (three) times daily as needed for muscle spasms.   diphenhydrAMINE 25 MG tablet Commonly known as:  BENADRYL Take 12.5-25 mg by mouth at bedtime.   escitalopram 10 MG tablet Commonly known as:  LEXAPRO Take 10 mg by mouth every other day.   Fluticasone-Salmeterol 250-50 MCG/DOSE Aepb Commonly known as:  ADVAIR Inhale 1 puff into the lungs daily.   guaiFENesin 600 MG 12 hr tablet Commonly known as:  MUCINEX Take 600 mg by mouth daily as needed for cough.   meloxicam 15 MG tablet Commonly known as:  MOBIC Take 15 mg by mouth daily.   oxyCODONE-acetaminophen 5-325 MG tablet Commonly known as:  Percocet Take 1 tablet by mouth every 4 (four) hours as needed for severe pain.   sildenafil 20 MG tablet Commonly known as:  REVATIO Take 20 mg by mouth daily as needed.        Signed: Kristoff Coonradt P 05/30/2018, 1:14 PM

## 2018-05-30 NOTE — Anesthesia Postprocedure Evaluation (Signed)
Anesthesia Post Note  Patient: Patrick Craig  Procedure(s) Performed: Right Lumbar Three-Four Laminectomy for synovial cyst (Right Spine Lumbar)     Patient location during evaluation: PACU Anesthesia Type: General Level of consciousness: awake and alert Pain management: pain level controlled Vital Signs Assessment: post-procedure vital signs reviewed and stable Respiratory status: spontaneous breathing, nonlabored ventilation, respiratory function stable and patient connected to nasal cannula oxygen Cardiovascular status: blood pressure returned to baseline and stable Postop Assessment: no apparent nausea or vomiting Anesthetic complications: no    Last Vitals:  Vitals:   05/30/18 1003 05/30/18 1018  BP: 135/82 121/86  Pulse: 69 85  Resp: 13 15  Temp:    SpO2: 95% 93%    Last Pain:  Vitals:   05/30/18 1018  TempSrc:   PainSc: 0-No pain                 Jaccob Czaplicki,W. EDMOND

## 2018-05-31 ENCOUNTER — Encounter (HOSPITAL_COMMUNITY): Payer: Self-pay | Admitting: Neurosurgery

## 2018-06-16 DIAGNOSIS — M545 Low back pain: Secondary | ICD-10-CM | POA: Diagnosis not present

## 2018-06-24 DIAGNOSIS — M545 Low back pain: Secondary | ICD-10-CM | POA: Diagnosis not present

## 2018-06-30 DIAGNOSIS — M545 Low back pain: Secondary | ICD-10-CM | POA: Diagnosis not present

## 2018-09-01 ENCOUNTER — Other Ambulatory Visit: Payer: Self-pay

## 2018-09-30 DIAGNOSIS — K5730 Diverticulosis of large intestine without perforation or abscess without bleeding: Secondary | ICD-10-CM | POA: Diagnosis not present

## 2018-09-30 DIAGNOSIS — Z1211 Encounter for screening for malignant neoplasm of colon: Secondary | ICD-10-CM | POA: Diagnosis not present

## 2018-09-30 DIAGNOSIS — Z800 Family history of malignant neoplasm of digestive organs: Secondary | ICD-10-CM | POA: Diagnosis not present

## 2018-09-30 DIAGNOSIS — K649 Unspecified hemorrhoids: Secondary | ICD-10-CM | POA: Diagnosis not present

## 2018-09-30 DIAGNOSIS — K648 Other hemorrhoids: Secondary | ICD-10-CM | POA: Diagnosis not present

## 2018-10-14 DIAGNOSIS — J449 Chronic obstructive pulmonary disease, unspecified: Secondary | ICD-10-CM | POA: Diagnosis not present

## 2018-10-14 DIAGNOSIS — Z87891 Personal history of nicotine dependence: Secondary | ICD-10-CM | POA: Diagnosis not present

## 2018-10-30 DIAGNOSIS — E559 Vitamin D deficiency, unspecified: Secondary | ICD-10-CM | POA: Diagnosis not present

## 2018-10-30 DIAGNOSIS — Z0000 Encounter for general adult medical examination without abnormal findings: Secondary | ICD-10-CM | POA: Diagnosis not present

## 2018-10-30 DIAGNOSIS — E785 Hyperlipidemia, unspecified: Secondary | ICD-10-CM | POA: Diagnosis not present

## 2018-11-22 DIAGNOSIS — Z87891 Personal history of nicotine dependence: Secondary | ICD-10-CM | POA: Diagnosis not present

## 2018-11-24 DIAGNOSIS — Z0000 Encounter for general adult medical examination without abnormal findings: Secondary | ICD-10-CM | POA: Diagnosis not present

## 2018-11-24 DIAGNOSIS — B351 Tinea unguium: Secondary | ICD-10-CM | POA: Diagnosis not present

## 2018-11-24 DIAGNOSIS — Z23 Encounter for immunization: Secondary | ICD-10-CM | POA: Diagnosis not present

## 2018-11-24 DIAGNOSIS — E785 Hyperlipidemia, unspecified: Secondary | ICD-10-CM | POA: Diagnosis not present

## 2018-12-01 ENCOUNTER — Encounter: Payer: Self-pay | Admitting: Podiatry

## 2018-12-01 ENCOUNTER — Other Ambulatory Visit: Payer: Self-pay | Admitting: Podiatry

## 2018-12-01 ENCOUNTER — Other Ambulatory Visit: Payer: Self-pay

## 2018-12-01 ENCOUNTER — Ambulatory Visit: Payer: PPO | Admitting: Podiatry

## 2018-12-01 ENCOUNTER — Ambulatory Visit: Payer: PPO

## 2018-12-01 VITALS — BP 129/83 | HR 60 | Resp 16

## 2018-12-01 DIAGNOSIS — M205X1 Other deformities of toe(s) (acquired), right foot: Secondary | ICD-10-CM

## 2018-12-01 DIAGNOSIS — B351 Tinea unguium: Secondary | ICD-10-CM

## 2018-12-01 DIAGNOSIS — L603 Nail dystrophy: Secondary | ICD-10-CM | POA: Diagnosis not present

## 2018-12-01 NOTE — Patient Instructions (Signed)

## 2018-12-01 NOTE — Progress Notes (Signed)
Subjective:   Patient ID: Patrick Craig, male   DOB: 68 y.o.   MRN: [id number]   HPI 69 year old male presents the office today for concerns of toenail fungus as well as part of his right toenail growing back that was removed several years ago.  He states the nails are thick and hard to cut.  The right toenails not causing any pain but he like to have the corners removed from where the pieces of started to grow back in.  Denies any drainage or pus or any swelling.  He also states he has been diagnosed with hallux rigidus.  He does not have range of motion of his first big toe joints.  He did not want x-rays today.  He had previously discussed this with his orthopedic doctor.  They do not cause pain.  He does play golf regularly and is active without any significant discomfort.   Review of Systems  All other systems reviewed and are negative.  Past Medical History:  Diagnosis Date  . Cellulitis of arm, left 08/06/2014  . COPD (chronic obstructive pulmonary disease) ([place])   . Depression   . Erectile dysfunction   . History of kidney stones    "30 years ago"  . Hyperlipemia   . Sleep apnea     Past Surgical History:  Procedure Laterality Date  . APPENDECTOMY  1983  . HERNIA REPAIR Left 2000   inguinal  . INCISION AND DRAINAGE ABSCESS    . KNEE ARTHROSCOPY Left   . LUMBAR LAMINECTOMY/DECOMPRESSION MICRODISCECTOMY Right 05/30/2018   Procedure: Right Lumbar Three-Four Laminectomy for synovial cyst;  Surgeon: Kary Kos, MD;  Location: [facility];  Service: Neurosurgery;  Laterality: Right;  posterior  . SHOULDER SURGERY Bilateral      Current Outpatient Medications:  .  ibuprofen (ADVIL) 200 MG tablet, Take 200 mg by mouth every 6 (six) hours as needed., Disp: , Rfl:  .  acetaminophen (TYLENOL) 500 MG tablet, Take 1,000 mg by mouth every 6 (six) hours as needed for moderate pain., Disp: , Rfl:  .  atorvastatin (LIPITOR) 20 MG tablet, Take 20 mg by mouth daily., Disp: , Rfl:  .   buPROPion (WELLBUTRIN XL) 300 MG 24 hr tablet, Take 300 mg by mouth every other day., Disp: , Rfl:  .  cholecalciferol (VITAMIN D3) 25 MCG (1000 UT) tablet, Take 1,000 Units by mouth. , Disp: , Rfl:  .  cyclobenzaprine (FLEXERIL) 10 MG tablet, Take 10 mg by mouth 3 (three) times daily as needed for muscle spasms. , Disp: , Rfl:  .  diphenhydrAMINE (BENADRYL) 25 MG tablet, Take 12.5-25 mg by mouth at bedtime., Disp: , Rfl:  .  escitalopram (LEXAPRO) 10 MG tablet, Take 10 mg by mouth every other day. , Disp: , Rfl:  .  Fluticasone-Salmeterol (ADVAIR) 250-50 MCG/DOSE AEPB, Inhale 1 puff into the lungs daily. , Disp: , Rfl:  .  guaiFENesin (MUCINEX) 600 MG 12 hr tablet, Take 600 mg by mouth daily as needed for cough., Disp: , Rfl:  .  meloxicam (MOBIC) 15 MG tablet, Take 15 mg by mouth daily., Disp: , Rfl:  .  sildenafil (REVATIO) 20 MG tablet, Take 20 mg by mouth daily as needed., Disp: , Rfl:   Allergies  Allergen Reactions  . Erythromycin Base Other (See Comments)    GI Upset (intolerance)  . Penicillins Other (See Comments)    Unknown reaction, was told as a child Did it involve swelling of the face/tongue/throat, SOB, or low  BP? Unknown Did it involve sudden or severe rash/hives, skin peeling, or any reaction on the inside of your mouth or nose? Unknown Did you need to seek medical attention at a hospital or doctor's office? Unknown When did it last happen?Childhood If all above answers are "NO", may proceed with cephalosporin use.         Objective:  Physical Exam  General: AAO x3, NAD  Dermatological: All the nails are hypertrophic, dystrophic with yellow-brown discoloration.  Right hallux toenails been previously removed however there is spicules of nail present on both nail borders medial and lateral.  There is no drainage or pus there is no swelling or redness.  Vascular: Dorsalis Pedis artery and Posterior Tibial artery pedal pulses are 2/4 bilateral with immedate  capillary fill time.There is no pain with calf compression, swelling, warmth, erythema.   Neruologic: Grossly intact via light touch bilateral. Vibratory intact via tuning fork bilateral.  No Babinski or clonus noted bilateral.   Musculoskeletal: Dorsal prominence of the dorsomedial aspect of bilateral first IPJ's.  Decreased range of motion of the first MPJs.  No pain.  Muscular strength 5/5 in all groups tested bilateral.  Gait: Unassisted, Nonantalgic.       Assessment:   Onychomycosis, onychodystrophy; hallux rigidus     Plan:  -Treatment options discussed including all alternatives, risks, and complications -Etiology of symptoms were discussed -Discussed with him right total nail removal which he wants to do we will plan on doing this another time.  No special after golf season. -Discussed reconstruction nail fungus.  He does drink regularly  Through [employer]..  He has responded well on oral Lamisil.  Ordered a compound cream today discussed use and side effects. -Discussed wearing a stiff soled shoe for the hallux rigidus.  Hold off on x-rays today his request.  Return if symptoms worsen or fail to improve.  Trula Slade DPM

## 2018-12-30 ENCOUNTER — Other Ambulatory Visit: Payer: Self-pay

## 2018-12-30 DIAGNOSIS — Z20822 Contact with and (suspected) exposure to covid-19: Secondary | ICD-10-CM

## 2019-01-01 ENCOUNTER — Encounter: Payer: Self-pay | Admitting: Podiatry

## 2019-01-01 LAB — NOVEL CORONAVIRUS, NAA: SARS-CoV-2, NAA: NOT DETECTED

## 2019-03-23 DIAGNOSIS — D225 Melanocytic nevi of trunk: Secondary | ICD-10-CM | POA: Diagnosis not present

## 2019-03-23 DIAGNOSIS — L821 Other seborrheic keratosis: Secondary | ICD-10-CM | POA: Diagnosis not present

## 2019-03-23 DIAGNOSIS — D1801 Hemangioma of skin and subcutaneous tissue: Secondary | ICD-10-CM | POA: Diagnosis not present

## 2019-03-23 DIAGNOSIS — L718 Other rosacea: Secondary | ICD-10-CM | POA: Diagnosis not present

## 2019-03-23 DIAGNOSIS — B353 Tinea pedis: Secondary | ICD-10-CM | POA: Diagnosis not present

## 2019-03-31 DIAGNOSIS — H524 Presbyopia: Secondary | ICD-10-CM | POA: Diagnosis not present

## 2019-03-31 DIAGNOSIS — H40053 Ocular hypertension, bilateral: Secondary | ICD-10-CM | POA: Diagnosis not present

## 2019-03-31 DIAGNOSIS — H2513 Age-related nuclear cataract, bilateral: Secondary | ICD-10-CM | POA: Diagnosis not present

## 2019-05-01 ENCOUNTER — Encounter (HOSPITAL_COMMUNITY): Payer: Self-pay

## 2019-05-01 ENCOUNTER — Other Ambulatory Visit: Payer: Self-pay

## 2019-05-01 ENCOUNTER — Ambulatory Visit (HOSPITAL_COMMUNITY)
Admission: EM | Admit: 2019-05-01 | Discharge: 2019-05-01 | Disposition: A | Discharge status: Home / Self Care | Payer: PPO | Attending: Family Medicine | Admitting: Family Medicine

## 2019-05-01 DIAGNOSIS — H6982 Other specified disorders of Eustachian tube, left ear: Secondary | ICD-10-CM

## 2019-05-01 NOTE — ED Provider Notes (Signed)
[facility]    CSN: [id number] Arrival date & time: 05/01/19  0849      History   Chief Complaint Chief Complaint  Patient presents with  . Otalgia  . Sore Throat  . Hoarse    HPI Patrick Craig is a 69 y.o. male.   HPI   Patient states he has periodic left ear pain.  He states that he has some discomfort in the left side of his throat as well.  He thought it might be allergies.  He started taking his Xyzal.  He still has discomfort.  He is leaving with his wife for vacation and wants to be checked before he goes.  He does have a history of 40 years of cigarette smoking.  Chronic bronchitis and "mild" emphysema.  He is not having any wheezing or shortness of breath.  He does feel like he has an increase in the mucus in his chest.  Some runny and stuffy nose.  No change in hearing.  He states that he has had left ear discomfort off and on repeatedly. He has had his coronavirus vaccinations.   Past Medical History:  Diagnosis Date  . Cellulitis of arm, left 08/06/2014  . COPD (chronic obstructive pulmonary disease) ([place])   . Depression   . Erectile dysfunction   . History of kidney stones    "30 years ago"  . Hyperlipemia   . Sleep apnea     Patient Active Problem List   Diagnosis Date Noted  . Synovial cyst 05/30/2018  . OSA (obstructive sleep apnea) 04/06/2016  . ED (erectile dysfunction) 04/20/2015  . Muscle cramps 04/20/2015  . Cellulitis of arm 08/06/2014  . COPD, mild ([place]) 08/06/2014  . Depression 08/06/2014  . Hyperlipemia, mixed 08/06/2014    Past Surgical History:  Procedure Laterality Date  . APPENDECTOMY  1983  . HERNIA REPAIR Left 2000   inguinal  . INCISION AND DRAINAGE ABSCESS    . KNEE ARTHROSCOPY Left   . LUMBAR LAMINECTOMY/DECOMPRESSION MICRODISCECTOMY Right 05/30/2018   Procedure: Right Lumbar Three-Four Laminectomy for synovial cyst;  Surgeon: Kary Kos, MD;  Location: [facility];  Service: Neurosurgery;  Laterality: Right;   posterior  . SHOULDER SURGERY Bilateral        Home Medications    Prior to Admission medications   Medication Sig Start Date End Date Taking? Authorizing Provider  levocetirizine (XYZAL) 5 MG tablet Take 5 mg by mouth every evening.   Yes [provider]  atorvastatin (LIPITOR) 20 MG tablet Take 20 mg by mouth daily.    [provider]  buPROPion (WELLBUTRIN XL) 300 MG 24 hr tablet Take 300 mg by mouth every other day.    [provider]  cholecalciferol (VITAMIN D3) 25 MCG (1000 UT) tablet Take 1,000 Units by mouth.     [provider]  diphenhydrAMINE (BENADRYL) 25 MG tablet Take 12.5-25 mg by mouth at bedtime.    [provider]  escitalopram (LEXAPRO) 10 MG tablet Take 10 mg by mouth every other day.  04/16/18   [provider]  Fluticasone-Salmeterol (ADVAIR) 250-50 MCG/DOSE AEPB Inhale 1 puff into the lungs daily.     [provider]  guaiFENesin (MUCINEX) 600 MG 12 hr tablet Take 600 mg by mouth daily as needed for cough.    [provider]  ibuprofen (ADVIL) 200 MG tablet Take 200 mg by mouth every 6 (six) hours as needed.    [provider]  [place]  [facility]  ANTIFUNGAL (NAIL)#1    [provider]  sildenafil (REVATIO) 20 MG tablet Take 20 mg by mouth daily as needed.    [provider]    Family History History reviewed. No pertinent family history.  Social History Social History   Tobacco Use  . Smoking status: Former Smoker    Types: Cigarettes    Quit date: 05/26/2008    Years since quitting: 10.9  . Smokeless tobacco: Never Used  Substance Use Topics  . Alcohol use: Yes    Comment: 1-2 drinks every 1-2 days  . Drug use: Never     Allergies   Erythromycin base and Penicillins   Review of Systems Review of Systems  HENT: Positive for congestion, ear pain and postnasal drip. Negative for ear discharge and hearing loss.   Respiratory:  Negative for cough and shortness of breath.      Physical Exam Triage Vital Signs ED Triage Vitals  Enc Vitals Group     BP 05/01/19 0912 131/82     Pulse Rate 05/01/19 0912 62     Resp 05/01/19 0912 18     Temp 05/01/19 0912 97.8 F (36.6 C)     Temp src --      SpO2 05/01/19 0912 99 %     Weight --      Height --      Head Circumference --      Peak Flow --      Pain Score 05/01/19 0909 5     Pain Loc --      Pain Edu? --      Excl. in [place]? --    No data found.  Updated Vital Signs BP 131/82 (BP Location: Right Arm)   Pulse 62   Temp 97.8 F (36.6 C)   Resp 18   SpO2 99%      Physical Exam Constitutional:      General: He is not in acute distress.    Appearance: Normal appearance. He is well-developed and normal weight.  HENT:     Head: Normocephalic and atraumatic.     Right Ear: Tympanic membrane, ear canal and external ear normal.     Left Ear: Tympanic membrane, ear canal and external ear normal.     Nose: Nose normal. No congestion.     Mouth/Throat:     Mouth: Mucous membranes are moist.     Pharynx: No posterior oropharyngeal erythema.  Eyes:     Conjunctiva/sclera: Conjunctivae normal.     Pupils: Pupils are equal, round, and reactive to light.  Cardiovascular:     Rate and Rhythm: Normal rate.  Pulmonary:     Effort: Pulmonary effort is normal. No respiratory distress.  Musculoskeletal:        General: Normal range of motion.     Cervical back: Normal range of motion.  Lymphadenopathy:     Cervical: No cervical adenopathy.  Skin:    General: Skin is warm and dry.  Neurological:     Mental Status: He is alert.  Psychiatric:        Mood and Affect: Mood normal.        Behavior: Behavior normal.      UC Treatments / Results  Labs (all labs ordered are listed, but only abnormal results are displayed) Labs Reviewed - No data to display  EKG   Radiology No results found.  Procedures Procedures (including critical care  time)  Medications Ordered in UC Medications - No  data to display  Initial Impression / Assessment and Plan / UC Course  I have reviewed the triage vital signs and the nursing notes.  Pertinent labs & imaging results that were available during my care of the patient were reviewed by me and considered in my medical decision making (see chart for details).     Normal examination.  Patient describes eustachian tube dysfunction.  Is advised to try a nasal steroid.  Return as needed Final Clinical Impressions(s) / UC Diagnoses   Final diagnoses:  Eustachian tube dysfunction, left     Discharge Instructions     Continue to drink lots of water Use flonase or nasacort for the sinus drainage and ear discomfort   ED Prescriptions    None     PDMP not reviewed this encounter.   Raylene Everts, MD 05/01/19 [id number]

## 2019-05-01 NOTE — ED Triage Notes (Signed)
Pt reports having left ear pain, left-sided sore throat and hoarse x 3 weeks. Pt states this happened before when water got into  his ear. Pt put alcohol and peroxide in the left ear with somewhat relief.

## 2019-05-01 NOTE — Discharge Instructions (Addendum)
Continue to drink lots of water Use flonase or nasacort for the sinus drainage and ear discomfort

## 2019-08-22 ENCOUNTER — Other Ambulatory Visit: Payer: Self-pay

## 2019-08-22 ENCOUNTER — Encounter (HOSPITAL_BASED_OUTPATIENT_CLINIC_OR_DEPARTMENT_OTHER): Payer: Self-pay | Admitting: Emergency Medicine

## 2019-08-22 ENCOUNTER — Emergency Department (HOSPITAL_BASED_OUTPATIENT_CLINIC_OR_DEPARTMENT_OTHER)
Admission: EM | Admit: 2019-08-22 | Discharge: 2019-08-22 | Disposition: A | Discharge status: Home / Self Care | Payer: PPO | Attending: Emergency Medicine | Admitting: Emergency Medicine

## 2019-08-22 ENCOUNTER — Emergency Department (HOSPITAL_BASED_OUTPATIENT_CLINIC_OR_DEPARTMENT_OTHER): Payer: PPO

## 2019-08-22 DIAGNOSIS — M545 Low back pain: Secondary | ICD-10-CM | POA: Diagnosis not present

## 2019-08-22 DIAGNOSIS — Z87442 Personal history of urinary calculi: Secondary | ICD-10-CM | POA: Diagnosis not present

## 2019-08-22 DIAGNOSIS — Y999 Unspecified external cause status: Secondary | ICD-10-CM | POA: Diagnosis not present

## 2019-08-22 DIAGNOSIS — Y9289 Other specified places as the place of occurrence of the external cause: Secondary | ICD-10-CM | POA: Insufficient documentation

## 2019-08-22 DIAGNOSIS — R252 Cramp and spasm: Secondary | ICD-10-CM | POA: Insufficient documentation

## 2019-08-22 DIAGNOSIS — J449 Chronic obstructive pulmonary disease, unspecified: Secondary | ICD-10-CM | POA: Insufficient documentation

## 2019-08-22 DIAGNOSIS — R0781 Pleurodynia: Secondary | ICD-10-CM | POA: Diagnosis not present

## 2019-08-22 DIAGNOSIS — E782 Mixed hyperlipidemia: Secondary | ICD-10-CM | POA: Insufficient documentation

## 2019-08-22 DIAGNOSIS — Z791 Long term (current) use of non-steroidal anti-inflammatories (NSAID): Secondary | ICD-10-CM | POA: Insufficient documentation

## 2019-08-22 DIAGNOSIS — N529 Male erectile dysfunction, unspecified: Secondary | ICD-10-CM | POA: Insufficient documentation

## 2019-08-22 DIAGNOSIS — Z9089 Acquired absence of other organs: Secondary | ICD-10-CM | POA: Diagnosis not present

## 2019-08-22 DIAGNOSIS — Y939 Activity, unspecified: Secondary | ICD-10-CM | POA: Diagnosis not present

## 2019-08-22 DIAGNOSIS — X58XXXA Exposure to other specified factors, initial encounter: Secondary | ICD-10-CM | POA: Diagnosis not present

## 2019-08-22 DIAGNOSIS — Z87891 Personal history of nicotine dependence: Secondary | ICD-10-CM | POA: Diagnosis not present

## 2019-08-22 DIAGNOSIS — S39012A Strain of muscle, fascia and tendon of lower back, initial encounter: Secondary | ICD-10-CM | POA: Diagnosis not present

## 2019-08-22 LAB — URINALYSIS, ROUTINE W REFLEX MICROSCOPIC
Bilirubin Urine: NEGATIVE
Glucose, UA: NEGATIVE mg/dL
Hgb urine dipstick: NEGATIVE
Ketones, ur: NEGATIVE mg/dL
Leukocytes,Ua: NEGATIVE
Nitrite: NEGATIVE
Protein, ur: NEGATIVE mg/dL
Specific Gravity, Urine: 1.015 (ref 1.005–1.030)
pH: 6.5 (ref 5.0–8.0)

## 2019-08-22 MED ORDER — CYCLOBENZAPRINE HCL 10 MG PO TABS: 10.0000 mg | ORAL_TABLET | Freq: Two times a day (BID) | ORAL | 0 refills | Status: DC | PRN

## 2019-08-22 MED ORDER — ETODOLAC 300 MG PO CAPS: 300.0000 mg | ORAL_CAPSULE | Freq: Three times a day (TID) | ORAL | 0 refills | Status: DC

## 2019-08-22 NOTE — ED Notes (Signed)
Pt discharged to home. Discharge instructions have been discussed with patient and/or family members. Pt verbally acknowledges understanding d/c instructions, and endorses comprehension to checkout at registration before leaving.  °

## 2019-08-22 NOTE — ED Provider Notes (Signed)
[facility] EMERGENCY DEPARTMENT Provider Note   CSN: [id number] Arrival date & time: 08/22/19  1651     History Chief Complaint  Patient presents with  . Back Pain    Patrick Craig is a 69 y.o. male.  HPI   Patient presents ED for evaluation of back pain.  Patient states it started several weeks ago.  The pain has been in the right lower back and flank area.  He was doing a lot of activity before it started.  He notices certain positions and movement increase the pain.  He has tried taking ibuprofen with some temporary improvement but does feel like overall the pain has persisted and getting worse at times.  He is not having any shortness of breath.  No fevers or cough.  He denies any numbness or weakness.  No dysuria.  No abdominal pain.  No fevers or chills.  Past Medical History:  Diagnosis Date  . Cellulitis of arm, left 08/06/2014  . COPD (chronic obstructive pulmonary disease) ([place])   . Depression   . Erectile dysfunction   . History of kidney stones    "30 years ago"  . Hyperlipemia   . Sleep apnea     Patient Active Problem List   Diagnosis Date Noted  . Synovial cyst 05/30/2018  . OSA (obstructive sleep apnea) 04/06/2016  . ED (erectile dysfunction) 04/20/2015  . Muscle cramps 04/20/2015  . Cellulitis of arm 08/06/2014  . COPD, mild ([place]) 08/06/2014  . Depression 08/06/2014  . Hyperlipemia, mixed 08/06/2014    Past Surgical History:  Procedure Laterality Date  . APPENDECTOMY  1983  . HERNIA REPAIR Left 2000   inguinal  . INCISION AND DRAINAGE ABSCESS    . KNEE ARTHROSCOPY Left   . LUMBAR LAMINECTOMY/DECOMPRESSION MICRODISCECTOMY Right 05/30/2018   Procedure: Right Lumbar Three-Four Laminectomy for synovial cyst;  Surgeon: Kary Kos, MD;  Location: [facility];  Service: Neurosurgery;  Laterality: Right;  posterior  . SHOULDER SURGERY Bilateral        No family history on file.  Social History   Tobacco Use  . Smoking status: Former  Smoker    Types: Cigarettes    Quit date: 05/26/2008    Years since quitting: 11.2  . Smokeless tobacco: Never Used  Vaping Use  . Vaping Use: Never used  Substance Use Topics  . Alcohol use: Yes    Comment: 1-2 drinks every 1-2 days  . Drug use: Never    Home Medications Prior to Admission medications   Medication Sig Start Date End Date Taking? Authorizing Provider  atorvastatin (LIPITOR) 20 MG tablet Take 20 mg by mouth daily.    [provider]  buPROPion (WELLBUTRIN XL) 300 MG 24 hr tablet Take 300 mg by mouth every other day.    [provider]  cholecalciferol (VITAMIN D3) 25 MCG (1000 UT) tablet Take 1,000 Units by mouth.     [provider]  cyclobenzaprine (FLEXERIL) 10 MG tablet Take 1 tablet (10 mg total) by mouth 2 (two) times daily as needed for muscle spasms. 08/22/19   Dorie Rank, MD  diphenhydrAMINE (BENADRYL) 25 MG tablet Take 12.5-25 mg by mouth at bedtime.    [provider]  escitalopram (LEXAPRO) 10 MG tablet Take 10 mg by mouth every other day.  04/16/18   [provider]  etodolac (LODINE) 300 MG capsule Take 1 capsule (300 mg total) by mouth every 8 (eight) hours. 08/22/19   Dorie Rank, MD  Fluticasone-Salmeterol (  ADVAIR) 250-50 MCG/DOSE AEPB Inhale 1 puff into the lungs daily.     [provider]  guaiFENesin (MUCINEX) 600 MG 12 hr tablet Take 600 mg by mouth daily as needed for cough.    [provider]  levocetirizine (XYZAL) 5 MG tablet Take 5 mg by mouth every evening.    [provider]  [facility]  ANTIFUNGAL (NAIL)#1    [provider]  sildenafil (REVATIO) 20 MG tablet Take 20 mg by mouth daily as needed.    [provider]    Allergies    Erythromycin base and Penicillins  Review of Systems   Review of Systems  All other systems reviewed and are negative.   Physical Exam Updated Vital Signs BP (!) 145/94 (BP Location: Left Arm)    Pulse 67   Temp 99.2 F (37.3 C) (Oral)   Resp 18   Ht 1.829 m (6')   Wt (!) 102.1 kg   SpO2 97%   BMI 30.52 kg/m   Physical Exam Vitals and nursing note reviewed.  Constitutional:      General: He is not in acute distress.    Appearance: He is well-developed.  HENT:     Head: Normocephalic and atraumatic.     Right Ear: External ear normal.     Left Ear: External ear normal.  Eyes:     General: No scleral icterus.       Right eye: No discharge.        Left eye: No discharge.     Conjunctiva/sclera: Conjunctivae normal.  Neck:     Trachea: No tracheal deviation.  Cardiovascular:     Rate and Rhythm: Normal rate and regular rhythm.  Pulmonary:     Effort: Pulmonary effort is normal. No respiratory distress.     Breath sounds: No stridor. No wheezing.     Comments: ttp right flank Abdominal:     General: There is no distension.  Musculoskeletal:        General: No swelling or deformity.     Cervical back: Neck supple.  Skin:    General: Skin is warm and dry.     Findings: No rash.  Neurological:     Mental Status: He is alert.     Cranial Nerves: Cranial nerve deficit: no gross deficits.     ED Results / Procedures / Treatments   Labs (all labs ordered are listed, but only abnormal results are displayed) Labs Reviewed  URINALYSIS, ROUTINE W REFLEX MICROSCOPIC    EKG None  Radiology DG Chest 2 View  Result Date: 08/22/2019 CLINICAL DATA:  Right rib pain. EXAM: CHEST - 2 VIEW COMPARISON:  09/14/2013 FINDINGS: Lungs are well expanded, symmetric, and clear. No pneumothorax or pleural effusion. Cardiac size within normal limits. Pulmonary vascularity is normal. Osseous structures are age-appropriate. No acute bone abnormality. IMPRESSION: No active cardiopulmonary disease. Electronically Signed   By: Fidela Salisbury MD   On: 08/22/2019 17:47   DG Lumbar Spine Complete  Result Date: 08/22/2019 CLINICAL DATA:  Right-sided low back pain several weeks.  No injury.  EXAM: LUMBAR SPINE - COMPLETE 4+ VIEW COMPARISON:  05/30/2018 FINDINGS: Vertebral body heights are maintained. There is moderate spondylosis throughout the lumbar spine to include moderate facet arthropathy over the mid to lower lumbar spine. Mild disc space narrowing at the L4-5 level. Subtle grade 1 anterolisthesis of L4 on L5 likely due to the moderate facet arthropathy. No acute compression fracture. IMPRESSION: 1. Moderate spondylosis  of the lumbar spine with mild disc disease at the L4-5 level. 2. Subtle grade 1 anterolisthesis of L4 on L5 due to the moderate facet arthropathy. Electronically Signed   By: Marin Olp M.D.   On: 08/22/2019 17:24    Procedures Procedures (including critical care time)  Medications Ordered in ED Medications - No data to display  ED Course  I have reviewed the triage vital signs and the nursing notes.  Pertinent labs & imaging results that were available during my care of the patient were reviewed by me and considered in my medical decision making (see chart for details).    MDM Rules/Calculators/A&P                          C/w muscle strain.  Not suggestive of ureteral colic.  UA negative.  Xrays without acute findings.  No sx to suggest acute infection.   Stable for dc Final Clinical Impression(s) / ED Diagnoses Final diagnoses:  Back strain, initial encounter    Rx / DC Orders ED Discharge Orders         Ordered    etodolac (LODINE) 300 MG capsule  Every 8 hours     Discontinue  Reprint    Note to Pharmacy: As needed for pain   08/22/19 1811    cyclobenzaprine (FLEXERIL) 10 MG tablet  2 times daily PRN     Discontinue  Reprint     08/22/19 1811           Dorie Rank, MD 08/22/19 1813

## 2019-08-22 NOTE — ED Triage Notes (Signed)
R low back pain for several weeks. No known injury.

## 2019-08-22 NOTE — Discharge Instructions (Addendum)
Take the medication as needed.  Follow up with your doctor if not improving in the week.

## 2019-09-26 DIAGNOSIS — R918 Other nonspecific abnormal finding of lung field: Secondary | ICD-10-CM | POA: Diagnosis not present

## 2019-09-26 DIAGNOSIS — R531 Weakness: Secondary | ICD-10-CM | POA: Diagnosis not present

## 2019-09-26 DIAGNOSIS — Z79899 Other long term (current) drug therapy: Secondary | ICD-10-CM | POA: Diagnosis not present

## 2019-09-26 DIAGNOSIS — Z87891 Personal history of nicotine dependence: Secondary | ICD-10-CM | POA: Diagnosis not present

## 2019-09-26 DIAGNOSIS — J449 Chronic obstructive pulmonary disease, unspecified: Secondary | ICD-10-CM | POA: Diagnosis not present

## 2019-09-26 DIAGNOSIS — E161 Other hypoglycemia: Secondary | ICD-10-CM | POA: Diagnosis not present

## 2019-09-26 DIAGNOSIS — E785 Hyperlipidemia, unspecified: Secondary | ICD-10-CM | POA: Diagnosis not present

## 2019-09-26 DIAGNOSIS — S0990XA Unspecified injury of head, initial encounter: Secondary | ICD-10-CM | POA: Diagnosis not present

## 2019-09-26 DIAGNOSIS — E860 Dehydration: Secondary | ICD-10-CM | POA: Diagnosis not present

## 2019-09-26 DIAGNOSIS — R52 Pain, unspecified: Secondary | ICD-10-CM | POA: Diagnosis not present

## 2019-09-26 DIAGNOSIS — E162 Hypoglycemia, unspecified: Secondary | ICD-10-CM | POA: Diagnosis not present

## 2019-09-26 DIAGNOSIS — R079 Chest pain, unspecified: Secondary | ICD-10-CM | POA: Diagnosis not present

## 2019-09-26 DIAGNOSIS — T675XXA Heat exhaustion, unspecified, initial encounter: Secondary | ICD-10-CM | POA: Diagnosis not present

## 2019-10-01 DIAGNOSIS — H40053 Ocular hypertension, bilateral: Secondary | ICD-10-CM | POA: Diagnosis not present

## 2019-10-12 DIAGNOSIS — J449 Chronic obstructive pulmonary disease, unspecified: Secondary | ICD-10-CM | POA: Diagnosis not present

## 2019-10-30 DIAGNOSIS — J4489 Other specified chronic obstructive pulmonary disease: Secondary | ICD-10-CM

## 2019-10-30 DIAGNOSIS — J449 Chronic obstructive pulmonary disease, unspecified: Secondary | ICD-10-CM

## 2019-10-30 DIAGNOSIS — I2510 Atherosclerotic heart disease of native coronary artery without angina pectoris: Secondary | ICD-10-CM

## 2019-10-30 HISTORY — DX: Other specified chronic obstructive pulmonary disease: J44.89

## 2019-10-30 HISTORY — DX: Chronic obstructive pulmonary disease, unspecified: J44.9

## 2019-10-30 HISTORY — DX: Atherosclerotic heart disease of native coronary artery without angina pectoris: I25.10

## 2019-11-19 DIAGNOSIS — Z23 Encounter for immunization: Secondary | ICD-10-CM | POA: Diagnosis not present

## 2019-11-26 DIAGNOSIS — Z87891 Personal history of nicotine dependence: Secondary | ICD-10-CM | POA: Diagnosis not present

## 2020-01-10 ENCOUNTER — Emergency Department (HOSPITAL_BASED_OUTPATIENT_CLINIC_OR_DEPARTMENT_OTHER)
Admission: EM | Admit: 2020-01-10 | Discharge: 2020-01-10 | Disposition: A | Discharge status: Home / Self Care | Payer: PPO | Attending: Emergency Medicine | Admitting: Emergency Medicine

## 2020-01-10 ENCOUNTER — Emergency Department (HOSPITAL_BASED_OUTPATIENT_CLINIC_OR_DEPARTMENT_OTHER): Payer: PPO

## 2020-01-10 ENCOUNTER — Other Ambulatory Visit: Payer: Self-pay

## 2020-01-10 ENCOUNTER — Encounter (HOSPITAL_BASED_OUTPATIENT_CLINIC_OR_DEPARTMENT_OTHER): Payer: Self-pay | Admitting: Emergency Medicine

## 2020-01-10 DIAGNOSIS — N50812 Left testicular pain: Secondary | ICD-10-CM | POA: Insufficient documentation

## 2020-01-10 DIAGNOSIS — Z79899 Other long term (current) drug therapy: Secondary | ICD-10-CM | POA: Diagnosis not present

## 2020-01-10 DIAGNOSIS — Z8719 Personal history of other diseases of the digestive system: Secondary | ICD-10-CM | POA: Diagnosis not present

## 2020-01-10 DIAGNOSIS — N433 Hydrocele, unspecified: Secondary | ICD-10-CM | POA: Diagnosis not present

## 2020-01-10 DIAGNOSIS — J449 Chronic obstructive pulmonary disease, unspecified: Secondary | ICD-10-CM | POA: Diagnosis not present

## 2020-01-10 DIAGNOSIS — N50819 Testicular pain, unspecified: Secondary | ICD-10-CM

## 2020-01-10 DIAGNOSIS — N5082 Scrotal pain: Secondary | ICD-10-CM

## 2020-01-10 DIAGNOSIS — Z87891 Personal history of nicotine dependence: Secondary | ICD-10-CM | POA: Insufficient documentation

## 2020-01-10 DIAGNOSIS — N4341 Spermatocele of epididymis, single: Secondary | ICD-10-CM | POA: Diagnosis not present

## 2020-01-10 DIAGNOSIS — N503 Cyst of epididymis: Secondary | ICD-10-CM | POA: Diagnosis not present

## 2020-01-10 LAB — URINALYSIS, ROUTINE W REFLEX MICROSCOPIC
Bilirubin Urine: NEGATIVE
Glucose, UA: NEGATIVE mg/dL
Hgb urine dipstick: NEGATIVE
Ketones, ur: NEGATIVE mg/dL
Leukocytes,Ua: NEGATIVE
Nitrite: NEGATIVE
Protein, ur: NEGATIVE mg/dL
Specific Gravity, Urine: 1.015 (ref 1.005–1.030)
pH: 5.5 (ref 5.0–8.0)

## 2020-01-10 MED ORDER — DOXYCYCLINE HYCLATE 100 MG PO CAPS: 100.0000 mg | ORAL_CAPSULE | Freq: Two times a day (BID) | ORAL | 0 refills | Status: DC

## 2020-01-10 NOTE — Discharge Instructions (Addendum)
You were seen in the emergency department for evaluation of left-sided testicular pain.  You had an ultrasound that did not show any signs of testicular torsion.  They did see a small hydrocele on the right side and a large hydrocele on the left side.  This is possibly the cause of your pain.  We are treating you with antibiotics and recommend that you schedule a follow-up appointment with urology.  Return to the emergency department if any worsening or concerning symptoms.

## 2020-01-10 NOTE — ED Triage Notes (Signed)
L testicle pain x 3 hours.

## 2020-01-10 NOTE — ED Notes (Signed)
Patient transported to Ultrasound 

## 2020-01-10 NOTE — ED Provider Notes (Signed)
[facility] EMERGENCY DEPARTMENT Provider Note   CSN: [id number] Arrival date & time: 01/10/20  1249     History Chief Complaint  Patient presents with  . Testicle Pain    Patrick Craig is a 69 y.o. male.  He is complaining of acute onset of left testicular and scrotum pain that began a few hours ago.  There was no trauma.  No urinary symptoms.  He said he had prior back problems and has had some groin pain that he associates with that but it never radiated into the testicle.  No fevers or chills.  He called the nurse line and they recommended he come get an ultrasound.  He said that since having the ultrasound and urinating his pain is resolved.  The history is provided by the patient.  Testicle Pain This is a new problem. The current episode started 3 to 5 hours ago. The problem has been resolved. Pertinent negatives include no chest pain, no abdominal pain, no headaches and no shortness of breath. Nothing aggravates the symptoms. Nothing relieves the symptoms. He has tried nothing for the symptoms. The treatment provided significant relief.       Past Medical History:  Diagnosis Date  . Cellulitis of arm, left 08/06/2014  . COPD (chronic obstructive pulmonary disease) ([place])   . Depression   . Erectile dysfunction   . History of kidney stones    "30 years ago"  . Hyperlipemia   . Sleep apnea     Patient Active Problem List   Diagnosis Date Noted  . Synovial cyst 05/30/2018  . OSA (obstructive sleep apnea) 04/06/2016  . ED (erectile dysfunction) 04/20/2015  . Muscle cramps 04/20/2015  . Cellulitis of arm 08/06/2014  . COPD, mild ([place]) 08/06/2014  . Depression 08/06/2014  . Hyperlipemia, mixed 08/06/2014    Past Surgical History:  Procedure Laterality Date  . APPENDECTOMY  1983  . HERNIA REPAIR Left 2000   inguinal  . INCISION AND DRAINAGE ABSCESS    . KNEE ARTHROSCOPY Left   . LUMBAR LAMINECTOMY/DECOMPRESSION MICRODISCECTOMY Right 05/30/2018    Procedure: Right Lumbar Three-Four Laminectomy for synovial cyst;  Surgeon: Kary Kos, MD;  Location: [facility];  Service: Neurosurgery;  Laterality: Right;  posterior  . SHOULDER SURGERY Bilateral        No family history on file.  Social History   Tobacco Use  . Smoking status: Former Smoker    Types: Cigarettes    Quit date: 05/26/2008    Years since quitting: 11.6  . Smokeless tobacco: Never Used  Vaping Use  . Vaping Use: Never used  Substance Use Topics  . Alcohol use: Yes    Comment: 1-2 drinks every 1-2 days  . Drug use: Never    Home Medications Prior to Admission medications   Medication Sig Start Date End Date Taking? Authorizing Provider  atorvastatin (LIPITOR) 20 MG tablet Take 20 mg by mouth daily.    [provider]  buPROPion (WELLBUTRIN XL) 300 MG 24 hr tablet Take 300 mg by mouth every other day.    [provider]  cholecalciferol (VITAMIN D3) 25 MCG (1000 UT) tablet Take 1,000 Units by mouth.     [provider]  cyclobenzaprine (FLEXERIL) 10 MG tablet Take 1 tablet (10 mg total) by mouth 2 (two) times daily as needed for muscle spasms. 08/22/19   Dorie Rank, MD  diphenhydrAMINE (BENADRYL) 25 MG tablet Take 12.5-25 mg by mouth at bedtime.    [provider]  escitalopram (LEXAPRO) 10 MG tablet Take 10 mg by mouth every other day.  04/16/18   [provider]  etodolac (LODINE) 300 MG capsule Take 1 capsule (300 mg total) by mouth every 8 (eight) hours. 08/22/19   Dorie Rank, MD  Fluticasone-Salmeterol (ADVAIR) 250-50 MCG/DOSE AEPB Inhale 1 puff into the lungs daily.     [provider]  guaiFENesin (MUCINEX) 600 MG 12 hr tablet Take 600 mg by mouth daily as needed for cough.    [provider]  levocetirizine (XYZAL) 5 MG tablet Take 5 mg by mouth every evening.    [provider]  NON FORMULARY [facility]  ANTIFUNGAL (NAIL)#1    [provider]  sildenafil (REVATIO) 20 MG  tablet Take 20 mg by mouth daily as needed.    [provider]    Allergies    Erythromycin base and Penicillins  Review of Systems   Review of Systems  Constitutional: Negative for fever.  HENT: Negative for sore throat.   Eyes: Negative for visual disturbance.  Respiratory: Negative for shortness of breath.   Cardiovascular: Negative for chest pain.  Gastrointestinal: Negative for abdominal pain.  Genitourinary: Positive for testicular pain. Negative for dysuria.  Musculoskeletal: Positive for back pain (chronic).  Skin: Negative for rash.  Neurological: Negative for headaches.    Physical Exam Updated Vital Signs BP (!) 132/98 (BP Location: Left Arm)   Pulse 89   Temp 97.6 F (36.4 C) (Oral)   Resp 20   Ht 6' (1.829 m)   Wt 102.1 kg   SpO2 98%   BMI 30.52 kg/m   Physical Exam Vitals and nursing note reviewed.  Constitutional:      Appearance: Normal appearance. He is well-developed and well-nourished.  HENT:     Head: Normocephalic and atraumatic.  Eyes:     Conjunctiva/sclera: Conjunctivae normal.  Cardiovascular:     Rate and Rhythm: Normal rate and regular rhythm.     Pulses: Normal pulses.  Pulmonary:     Effort: Pulmonary effort is normal.  Abdominal:     General: There is no distension.     Tenderness: There is no abdominal tenderness.     Hernia: No hernia is present.  Genitourinary:    Penis: Normal and uncircumcised.      Testes: Normal.     Comments: Patient has nontender testicles both right and left.  Normal lie.  No overlying erythema. Musculoskeletal:        General: No deformity.     Cervical back: Neck supple.  Skin:    General: Skin is warm and dry.     Capillary Refill: Capillary refill takes less than 2 seconds.  Neurological:     General: No focal deficit present.     Mental Status: He is alert.     GCS: GCS eye subscore is 4. GCS verbal subscore is 5. GCS motor subscore is 6.  Psychiatric:        Mood and Affect: Mood  and affect normal.     ED Results / Procedures / Treatments   Labs (all labs ordered are listed, but only abnormal results are displayed) Labs Reviewed  URINALYSIS, ROUTINE W REFLEX MICROSCOPIC - Abnormal; Notable for the following components:      Result Value   Color, Urine STRAW (*)    All other components within normal limits  URINE CULTURE    EKG None  Radiology US SCROTUM W/DOPPLER  Result Date: 01/10/2020 CLINICAL DATA:  Left  testicular pain for 3 hours, history of inguinal hernia for 20 years ago EXAM: [place]: Complete ultrasound examination of the testicles, epididymis, and other scrotal structures was performed. Color and spectral Doppler ultrasound were also utilized to evaluate blood flow to the testicles. COMPARISON:  None. FINDINGS: Right testicle Measurements: 3.8 x 2.5 x 2.8 cm. No mass or microlithiasis visualized. Left testicle Measurements: 3.7 x 2.5 x 2.9 cm. No mass or microlithiasis visualized. Right epididymis:  Normal in size and appearance. Left epididymis: Incidental 1.3 cm cyst or spermatocele of the left epididymis. Otherwise normal in size and appearance. Hydrocele: Small right, moderate left hydroceles. Debris present in the left hydrocele. Incidental 4 mm scrotolith in the left scrotum. Varicocele:  None visualized. Pulsed Doppler interrogation of both testes demonstrates normal low resistance arterial and venous waveforms bilaterally. IMPRESSION: 1. Small right, moderate left hydroceles. Debris present in the left hydrocele suggestive of nonspecific infection or inflammation. Correlate with urinalysis 2. Normal size and sonographic appearance of the bilateral testicles with normal, symmetric bilateral arterial and venous Doppler flow. Electronically Signed   By: Eddie Candle M.D.   On: 01/10/2020 14:10    Procedures Procedures (including critical care time)  Medications Ordered in ED Medications -  No data to display  ED Course  I have reviewed the triage vital signs and the nursing notes.  Pertinent labs & imaging results that were available during my care of the patient were reviewed by me and considered in my medical decision making (see chart for details).    MDM Rules/Calculators/A&P                         69 year old male with atraumatic left testicular pain for few hours.  No prior history of same.  No urinary symptoms.  Differential includes torsion, epididymitis, orchitis, hydrocele, varicocele, kidney stone, hernia.  Ultrasound shows some hydroceles.  No evidence of torsion.  Will cover with some antibiotics.  Also gave him information for urology outpatient.  Return instructions discussed.  Final Clinical Impression(s) / ED Diagnoses Final diagnoses:  Testicle pain    Rx / DC Orders ED Discharge Orders         Ordered    doxycycline (VIBRAMYCIN) 100 MG capsule  2 times daily        01/10/20 1458           Hayden Rasmussen, MD 01/11/20 1348

## 2020-01-11 LAB — URINE CULTURE: Culture: NO GROWTH

## 2020-01-18 DIAGNOSIS — N430 Encysted hydrocele: Secondary | ICD-10-CM | POA: Diagnosis not present

## 2020-01-18 DIAGNOSIS — N50812 Left testicular pain: Secondary | ICD-10-CM | POA: Diagnosis not present

## 2020-01-18 DIAGNOSIS — N5311 Retarded ejaculation: Secondary | ICD-10-CM | POA: Diagnosis not present

## 2020-02-02 DIAGNOSIS — Z125 Encounter for screening for malignant neoplasm of prostate: Secondary | ICD-10-CM | POA: Diagnosis not present

## 2020-02-02 DIAGNOSIS — E785 Hyperlipidemia, unspecified: Secondary | ICD-10-CM | POA: Diagnosis not present

## 2020-02-09 DIAGNOSIS — J309 Allergic rhinitis, unspecified: Secondary | ICD-10-CM | POA: Diagnosis not present

## 2020-02-09 DIAGNOSIS — Z6831 Body mass index (BMI) 31.0-31.9, adult: Secondary | ICD-10-CM | POA: Diagnosis not present

## 2020-02-09 DIAGNOSIS — J349 Unspecified disorder of nose and nasal sinuses: Secondary | ICD-10-CM | POA: Diagnosis not present

## 2020-02-09 DIAGNOSIS — I2510 Atherosclerotic heart disease of native coronary artery without angina pectoris: Secondary | ICD-10-CM | POA: Diagnosis not present

## 2020-02-09 DIAGNOSIS — N529 Male erectile dysfunction, unspecified: Secondary | ICD-10-CM | POA: Diagnosis not present

## 2020-02-09 DIAGNOSIS — M7130 Other bursal cyst, unspecified site: Secondary | ICD-10-CM | POA: Diagnosis not present

## 2020-02-09 DIAGNOSIS — Z23 Encounter for immunization: Secondary | ICD-10-CM | POA: Diagnosis not present

## 2020-02-09 DIAGNOSIS — N400 Enlarged prostate without lower urinary tract symptoms: Secondary | ICD-10-CM | POA: Diagnosis not present

## 2020-02-09 DIAGNOSIS — E785 Hyperlipidemia, unspecified: Secondary | ICD-10-CM | POA: Diagnosis not present

## 2020-02-09 DIAGNOSIS — R918 Other nonspecific abnormal finding of lung field: Secondary | ICD-10-CM | POA: Diagnosis not present

## 2020-02-09 DIAGNOSIS — Z0000 Encounter for general adult medical examination without abnormal findings: Secondary | ICD-10-CM | POA: Diagnosis not present

## 2020-02-09 DIAGNOSIS — I700 Atherosclerosis of aorta: Secondary | ICD-10-CM | POA: Diagnosis not present

## 2020-02-09 DIAGNOSIS — R030 Elevated blood-pressure reading, without diagnosis of hypertension: Secondary | ICD-10-CM | POA: Diagnosis not present

## 2020-02-22 ENCOUNTER — Other Ambulatory Visit: Payer: Self-pay

## 2020-02-22 ENCOUNTER — Encounter: Payer: Self-pay | Admitting: Cardiology

## 2020-02-22 ENCOUNTER — Ambulatory Visit: Payer: PPO | Admitting: Cardiology

## 2020-02-22 VITALS — BP 140/70 | HR 64 | Ht 73.0 in | Wt 234.4 lb

## 2020-02-22 DIAGNOSIS — J449 Chronic obstructive pulmonary disease, unspecified: Secondary | ICD-10-CM

## 2020-02-22 DIAGNOSIS — I2510 Atherosclerotic heart disease of native coronary artery without angina pectoris: Secondary | ICD-10-CM

## 2020-02-22 DIAGNOSIS — N521 Erectile dysfunction due to diseases classified elsewhere: Secondary | ICD-10-CM | POA: Diagnosis not present

## 2020-02-22 DIAGNOSIS — E782 Mixed hyperlipidemia: Secondary | ICD-10-CM

## 2020-02-22 NOTE — Patient Instructions (Addendum)
Medication Instructions:  No changes  *If you need a refill on your cardiac medications before your next appointment, please call your pharmacy*   Lab Work: Not needed   Testing/Procedures: CT coronary calcium score. This test is done at [address]. This is $99 out of pocket.   Coronary CalciumScan A coronary calcium scan is an imaging test used to look for deposits of calcium and other fatty materials (plaques) in the inner lining of the blood vessels of the heart (coronary arteries). These deposits of calcium and plaques can partly clog and narrow the coronary arteries without producing any symptoms or warning signs. This puts a person at risk for a heart attack. This test can detect these deposits before symptoms develop. Tell a health care provider about:  Any allergies you have.  All medicines you are taking, including vitamins, herbs, eye drops, creams, and over-the-counter medicines.  Any problems you or family members have had with anesthetic medicines.  Any blood disorders you have.  Any surgeries you have had.  Any medical conditions you have.  Whether you are pregnant or may be pregnant. What are the risks? Generally, this is a safe procedure. However, problems may occur, including:  Harm to a pregnant woman and her unborn baby. This test involves the use of radiation. Radiation exposure can be dangerous to a pregnant woman and her unborn baby. If you are pregnant, you generally should not have this procedure done.  Slight increase in the risk of cancer. This is because of the radiation involved in the test. What happens before the procedure? No preparation is needed for this procedure. What happens during the procedure?  You will undress and remove any jewelry around your neck or chest.  You will put on a hospital gown.  Sticky electrodes will be placed on your chest. The electrodes will be connected to an electrocardiogram (ECG) machine  to record a tracing of the electrical activity of your heart.  A CT scanner will take pictures of your heart. During this time, you will be asked to lie still and hold your breath for 2-3 seconds while a picture of your heart is being taken. The procedure may vary among health care providers and hospitals. What happens after the procedure?  You can get dressed.  You can return to your normal activities.  It is up to you to get the results of your test. Ask your health care provider, or the department that is doing the test, when your results will be ready. Summary  A coronary calcium scan is an imaging test used to look for deposits of calcium and other fatty materials (plaques) in the inner lining of the blood vessels of the heart (coronary arteries).  Generally, this is a safe procedure. Tell your health care provider if you are pregnant or may be pregnant.  No preparation is needed for this procedure.  A CT scanner will take pictures of your heart.  You can return to your normal activities after the scan is done. This information is not intended to replace advice given to you by your health care provider. Make sure you discuss any questions you have with your health care provider. Document Released: 07/14/2007 Document Revised: 12/05/2015 Document Reviewed: 12/05/2015 Elsevier Interactive Patient Education  2017 [place]: At [facility], you and your health needs are our priority.  As part of our continuing mission to provide you with exceptional heart care, we have created designated  Provider Care Teams.  These Care Teams include your primary Cardiologist (physician) and Advanced Practice Providers (APPs -  Physician Assistants and Nurse Practitioners) who all work together to provide you with the care you need, when you need it.     Your next appointment:   1 to 2 month(s)  The format for your next appointment:   In Person  Provider:   Glenetta Hew,  MD

## 2020-02-22 NOTE — Progress Notes (Signed)
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  Long-term smoker successfully quit with Chantix years ago. He is on Advair, but has not required rescue inhaler.      ED (erectile dysfunction) (Chronic)    Rectal dysfunction is probably related to smoking, but is often a harbinger of other vascular disease.  We are evaluating for coronary disease with Coronary Calcium Score.      Hyperlipemia, mixed (Chronic)    Recently returned from atorvastatin to rosuvastatin 20 mg for LDL of 109 after CT scan showed coronary calcification.  Depending on coronary calcium score, may want to titrate up further.      Relevant Medications   rosuvastatin (CRESTOR) 20 MG tablet   Coronary artery calcification seen on CAT scan - Primary (Chronic)    He has had several CT scans that show reversal coronary calcification. He is totally asymptomatic and very active.  I do not think a stress test would be all that helpful, however I think baseline screening for quantification of coronary calcium is reasonable.  Plan: Coronary calcium score -> depending on these results, we may need to adjust statin dose and consider initiating aspirin. If the score is extremely elevated, we may consider proceeding with Coronary CTA to allow for full anatomical as well as potential ischemic evaluation.      Relevant Medications   rosuvastatin (CRESTOR) 20 MG tablet   Other Relevant Orders   EKG 12-Lead (Completed)   CT CARDIAC SCORING (SELF PAY ONLY)      ===================================  HPI:    Patrick Craig is a 70 y.o. male former 40-pack-year smoker with a PMH notable for COPD (Mostly Chronic Bronchitis with Mild Emphysema), HLD and Depression who is being seen today for the evaluation of Coronary Artery and Aortic Atherosclerosis noted on CT scan at the request of Deland Pretty, MD.  Patrick Craig was last seen on December 09, 2020 by Dr. Shelia Media for routine health maintenance/annual exam.  They reviewed a screening chest CT performed at [facility] that showed three-vessel coronary atherosclerosis as well as aortic atherosclerosis (similar finding for the last 3 years).Marland Kitchen "[place]" is relatively active and denies any symptoms. -> Was converted from atorvastatin to rosuvastatin, and aspirin 81 mg started.  Recent Hospitalizations: None  Reviewed  CV studies:    The following studies were reviewed today: (if available, images/films reviewed: From Epic Chart or Care Everywhere)  Chest CT 11/26/2019: Lung RADS 2.  Benign appearance.  Moderate centrilobular emphysema with diffuse bronchial thickening (chronic bronchitis).  Three-vessel coronary atherosclerosis and aortic atherosclerosis   Interval History:   Patrick Craig is here today for cardiac evaluation stating he is feels well.  No major issues.  He quit smoking in 2011 with the help of Chantix.  He has not looked back.  He also has mild hyperlipidemia but epidural recently was being treated with atorvastatin.  Now converted to rosuvastatin based on recent labs.   "Bill" exercise routinely doing at least 20 minutes 4 days a week on the [place] track.  He also walks about 2  miles just about every other day.  He may notice some exertional dyspnea when he finishes climbing to the top of a hill, but no more than what would be expected.  No chest pain or pressure.  The only thing he does notice shooting pain down his left leg from the hip to the calf.  When he stops and bends  down to stretch out his back, this is alleviated.  Denies any PND, orthopnea or edema.  CV Review of Symptoms (Summary) Cardiovascular ROS: no chest pain or dyspnea on exertion positive for - Not unexpected mild exertional dyspnea with significant exertion.  Left calf pain which sounds more radicular negative for - edema, irregular heartbeat, orthopnea, palpitations, paroxysmal nocturnal dyspnea, rapid heart rate, shortness of breath or Syncope/near syncope or TIA/amaurosis fugax, claudication  He describes an episode this past summer when he jumped out of a boat to swim to the beach of [place].  After swimming part away, he realized he could not touch the ground started to get very anxious.  He felt tunnel vision and a tightness sensation.  He started screaming for help.  He was however able to flat on his back, and once help arrived, he felt fine.  Most likely consistent with panic attack.  The patient does not have symptoms concerning for COVID-19 infection (fever, chills, cough, or new shortness of breath).   REVIEWED OF SYSTEMS   Review of Systems  Constitutional: Negative for malaise/fatigue and weight loss.  HENT: Negative for congestion, nosebleeds and sinus pain.   Respiratory: Positive for cough (Rare morning cough).   Cardiovascular: Negative for leg swelling.  Gastrointestinal: Negative for blood in stool and melena.  Genitourinary: Negative for hematuria.  Musculoskeletal: Negative for falls, joint pain and myalgias.  Neurological: Positive for tingling (Irregular premature extremities left hip down to his left calf with walking.). Negative for dizziness and focal weakness.  Psychiatric/Behavioral: Negative for memory loss. The patient is not nervous/anxious and does not have insomnia.        Well-controlled depression    I have reviewed and (if needed) personally updated the patient's problem list, medications, allergies, past medical and surgical history, social  and family history.   PAST MEDICAL HISTORY   Past Medical History:  Diagnosis Date   Cellulitis of arm, left 08/06/2014   Chronic bronchitis with emphysema (HCC)    Coronary artery calcification seen on CAT scan    Chest CT 11/26/2019: Lung RADS 2.  Benign appearance.  Moderate centrilobular emphysema with diffuse bronchial thickening (chronic bronchitis).  Three-vessel coronary atherosclerosis and aortic atherosclerosis   Depression    Erectile dysfunction    History of kidney stones    "30 years ago"   Hyperlipemia    Recently started on statin   Rosacea    Sleep apnea    Thoracic aorta atherosclerosis ([place])    Seen on CT scan   Vitamin D deficiency     PAST SURGICAL HISTORY   Past Surgical History:  Procedure Laterality Date   APPENDECTOMY  1983   HERNIA REPAIR Left 2000   inguinal   INCISION AND DRAINAGE ABSCESS     KNEE ARTHROSCOPY Left    Medial meniscus   LUMBAR LAMINECTOMY/DECOMPRESSION MICRODISCECTOMY Right 05/30/2018   Procedure: Right Lumbar Three-Four Laminectomy for synovial cyst;  Surgeon: Kary Kos, MD;  Location: [facility];  Service: Neurosurgery;  Laterality: Right;  posterior   SHOULDER SURGERY Bilateral    Clavicle surgeries    Immunization History  Administered Date(s) Administered   Influenza-Unspecified 02/10/2015, 11/19/2019   Pneumococcal Conjugate-13 11/05/2016, 02/09/2020   Tdap 11/23/2013   Zoster 11/05/2016   Zoster Recombinat (Shingrix) 11/07/2017, 05/25/2018    MEDICATIONS/ALLERGIES   Current Meds  Medication Sig   buPROPion (WELLBUTRIN XL) 300 MG 24 hr tablet Take 300 mg by mouth every other day.   cholecalciferol (VITAMIN D3) 25 MCG (1000 UT) tablet Take 1,000 Units by mouth.    cyclobenzaprine (FLEXERIL) 10 MG tablet Take 1 tablet (10 mg total) by mouth 2 (two) times daily as needed for muscle spasms.   doxycycline (VIBRAMYCIN) 100 MG capsule Take 1 capsule (100 mg total) by mouth 2 (two) times daily.    Fluticasone-Salmeterol (ADVAIR) 250-50 MCG/DOSE AEPB Inhale 1 puff into the lungs daily.    guaiFENesin (MUCINEX) 600 MG 12 hr tablet Take 600 mg by mouth daily as needed for cough.   levocetirizine (XYZAL) 5 MG tablet Take 5 mg by mouth every evening.   NON FORMULARY Pisinemo APOTHECARY  ANTIFUNGAL (NAIL)#1   rosuvastatin (CRESTOR) 20 MG tablet 1 tablet   sildenafil (REVATIO) 20 MG tablet Take 20 mg by mouth daily as needed.   [DISCONTINUED] atorvastatin (LIPITOR) 20 MG tablet Take 20 mg by mouth daily.   [DISCONTINUED] diphenhydrAMINE (BENADRYL) 25 MG tablet Take 12.5-25 mg by mouth at bedtime.   [DISCONTINUED] escitalopram (LEXAPRO) 10 MG tablet Take 10 mg by mouth every other day.    [DISCONTINUED] etodolac (LODINE) 300 MG capsule Take 1 capsule (300 mg total) by mouth every 8 (eight) hours.    Allergies  Allergen Reactions   Erythromycin Base Other (See Comments)    GI Upset (intolerance)   Penicillins Other (See Comments)    Unknown reaction, was told as a child Did it involve swelling of the face/tongue/throat, SOB, or low BP? Unknown Did it involve sudden or severe rash/hives, skin peeling, or any reaction on the inside of your mouth or nose? Unknown Did you need to seek medical attention at a hospital or doctor's office? Unknown When did it last happen?Childhood If all above answers are NO, may proceed with cephalosporin use.      SOCIAL HISTORY/FAMILY HISTORY   Reviewed in Epic:  Pertinent findings:  Social History   Tobacco Use   Smoking status: Former Smoker    Types: Cigarettes    Quit date: 05/26/2008    Years since quitting: 11.7   Smokeless tobacco: Never Used  Vaping Use   Vaping Use: Never used  Substance Use Topics   Alcohol use: Yes    Comment: 1-2 drinks every 1-2 days   Drug use: Never   Social History   Social History Narrative   He is a married father of 1 daughter (who lives in [place], [place]), and grandfather 3.   He  lives with his wife.   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  He also uses a [place] track 4-5 days a week.   Walking walks with his wife at least 2 times a week.   Family History  Problem Relation Age of Onset   Arthritis Mother    Failure to thrive Father 11   Arthritis Father    Down syndrome Brother    Colon cancer Brother 61   Healthy Daughter     OBJCTIVE -PE, EKG, labs   Wt Readings from Last 3 Encounters:  02/22/20 234 lb  6.4 oz (106.3 kg)  01/10/20 225 lb (102.1 kg)  08/22/19 (!) 225 lb (102.1 kg)    Physical Exam: BP 140/70    Pulse 64    Ht 6\' 1"  (1.854 m)    Wt 234 lb 6.4 oz (106.3 kg)    SpO2 97%    BMI 30.93 kg/m  Physical Exam Vitals reviewed.  Constitutional:      General: He is not in acute distress.    Appearance: Normal appearance. He is obese. He is not ill-appearing or toxic-appearing.     Comments: Well-groomed.  Healthy-appearing.  HENT:     Head: Normocephalic and atraumatic.  Eyes:     Extraocular Movements: Extraocular movements intact.     Pupils: Pupils are equal, round, and reactive to light.  Neck:     Vascular: No carotid bruit, hepatojugular reflux or JVD.  Cardiovascular:     Rate and Rhythm: Normal rate and regular rhythm.  No extrasystoles are present.    Chest Wall: PMI is not displaced (Difficult to assess).     Pulses: Intact distal pulses.     Heart sounds: S2 normal. Heart sounds are distant. No murmur heard. No friction rub. No gallop.   Pulmonary:     Effort: Pulmonary effort is normal. No respiratory distress.     Breath sounds: Normal breath sounds. No wheezing, rhonchi or rales.  Chest:     Chest wall: No tenderness.  Abdominal:     General: Bowel sounds are normal. There is no distension.     Palpations: Abdomen is soft. There is no mass.     Tenderness: There is no abdominal tenderness.     Comments: Mild truncal obesity.  No HSM or  bruit  Musculoskeletal:        General: No swelling. Normal range of motion.     Cervical back: Normal range of motion and neck supple.  Neurological:     General: No focal deficit present.     Mental Status: He is alert and oriented to person, place, and time.     Cranial Nerves: No cranial nerve deficit.     Gait: Gait normal.  Psychiatric:        Mood and Affect: Mood normal.        Behavior: Behavior normal.        Thought Content: Thought content normal.        Judgment: Judgment normal.       Adult ECG Report  Rate: 64 ;  Rhythm: normal sinus rhythm and Normal axis, intervals and durations.;   Narrative Interpretation: Normal EKG  Recent Labs:    02/02/2020: TC 184, TG 103, HDL 56, LDL 109 (was converted to rosuvastatin from atorvastatin); No results found for: CHOL, HDL, LDLCALC, LDLDIRECT, TRIG, CHOLHDL Lab Results  Component Value Date   CREATININE 1.08 05/22/2018   BUN 21 05/22/2018   NA 139 05/22/2018   K 4.4 05/22/2018   CL 107 05/22/2018   CO2 25 05/22/2018   CBC Latest Ref Rng & Units 05/22/2018  WBC 4.0 - 10.5 K/uL 5.2  Hemoglobin 13.0 - 17.0 g/dL 15.0  Hematocrit 39.0 - 52.0 % 44.0  Platelets 150 - 400 K/uL 184    No results found for: TSH  ==================================================  COVID-19 Education: The signs and symptoms of COVID-19 were discussed with the patient and how to seek care for testing (follow up with PCP or arrange E-visit).   The importance of social distancing and COVID-19 vaccination was discussed  today. The patient is practicing social distancing & Masking.   I spent a total of 44 minutes with the patient spent in direct patient consultation.  Additional time spent with chart review  / charting (studies, outside notes, etc): 20 min -> I reviewed his entire history, updated the computer record, etc.  Labs, CT scan are reviewed. Total Time: 64 min  Current medicines are reviewed at length with the patient today.  (+/-  concerns) he had questions about switching from atorvastatin to rosuvastatin  This visit occurred during the SARS-CoV-2 public health emergency.  Safety protocols were in place, including screening questions prior to the visit, additional usage of staff PPE, and extensive cleaning of exam room while observing appropriate contact time as indicated for disinfecting solutions.  Notice: This dictation was prepared with Dragon dictation along with smaller phrase technology. Any transcriptional errors that result from this process are unintentional and may not be corrected upon review.  Patient Instructions / Medication Changes & Studies & Tests Ordered   Patient Instructions  Medication Instructions:  No changes  *If you need a refill on your cardiac medications before your next appointment, please call your pharmacy*   Lab Work: Not needed   Testing/Procedures: CT coronary calcium score. This test is done at [address]. This is $99 out of pocket.   Coronary CalciumScan A coronary calcium scan is an imaging test used to look for deposits of calcium and other fatty materials (plaques) in the inner lining of the blood vessels of the heart (coronary arteries). These deposits of calcium and plaques can partly clog and narrow the coronary arteries without producing any symptoms or warning signs. This puts a person at risk for a heart attack. This test can detect these deposits before symptoms develop. Tell a health care provider about:  Any allergies you have.  All medicines you are taking, including vitamins, herbs, eye drops, creams, and over-the-counter medicines.  Any problems you or family members have had with anesthetic medicines.  Any blood disorders you have.  Any surgeries you have had.  Any medical conditions you have.  Whether you are pregnant or may be pregnant. What are the risks? Generally, this is a safe procedure. However, problems may occur,  including:  Harm to a pregnant woman and her unborn baby. This test involves the use of radiation. Radiation exposure can be dangerous to a pregnant woman and her unborn baby. If you are pregnant, you generally should not have this procedure done.  Slight increase in the risk of cancer. This is because of the radiation involved in the test. What happens before the procedure? No preparation is needed for this procedure. What happens during the procedure?  You will undress and remove any jewelry around your neck or chest.  You will put on a hospital gown.  Sticky electrodes will be placed on your chest. The electrodes will be connected to an electrocardiogram (ECG) machine to record a tracing of the electrical activity of your heart.  A CT scanner will take pictures of your heart. During this time, you will be asked to lie still and hold your breath for 2-3 seconds while a picture of your heart is being taken. The procedure may vary among health care providers and hospitals. What happens after the procedure?  You can get dressed.  You can return to your normal activities.  It is up to you to get the results of your test. Ask your health care provider, or  the department that is doing the test, when your results will be ready. Summary  A coronary calcium scan is an imaging test used to look for deposits of calcium and other fatty materials (plaques) in the inner lining of the blood vessels of the heart (coronary arteries).  Generally, this is a safe procedure. Tell your health care provider if you are pregnant or may be pregnant.  No preparation is needed for this procedure.  A CT scanner will take pictures of your heart.  You can return to your normal activities after the scan is done. This information is not intended to replace advice given to you by your health care provider. Make sure you discuss any questions you have with your health care provider. Document Released: 07/14/2007  Document Revised: 12/05/2015 Document Reviewed: 12/05/2015 Elsevier Interactive Patient Education  2017 [place]: At [facility], you and your health needs are our priority.  As part of our continuing mission to provide you with exceptional heart care, we have created designated Provider Care Teams.  These Care Teams include your primary Cardiologist (physician) and Advanced Practice Providers (APPs -  Physician Assistants and Nurse Practitioners) who all work together to provide you with the care you need, when you need it.     Your next appointment:   1 to 2 month(s)  The format for your next appointment:   In Person  Provider:   Glenetta Hew, MD       Studies Ordered:   Orders Placed This Encounter  Procedures   CT CARDIAC SCORING (SELF PAY ONLY)   EKG 12-Lead     Glenetta Hew, M.D., M.S. Interventional Cardiologist   Pager # [phone] Phone # [phone] [address] [place], [place] [address]   Thank you for choosing Heartcare at [facility]!!

## 2020-02-24 ENCOUNTER — Encounter: Payer: Self-pay | Admitting: Cardiology

## 2020-02-24 NOTE — Assessment & Plan Note (Signed)
Rectal dysfunction is probably related to smoking, but is often a harbinger of other vascular disease.  We are evaluating for coronary disease with Coronary Calcium Score.

## 2020-02-24 NOTE — Assessment & Plan Note (Signed)
Recently returned from atorvastatin to rosuvastatin 20 mg for LDL of 109 after CT scan showed coronary calcification.  Depending on coronary calcium score, may want to titrate up further.

## 2020-02-24 NOTE — Assessment & Plan Note (Signed)
He has had several CT scans that show reversal coronary calcification. He is totally asymptomatic and very active.  I do not think a stress test would be all that helpful, however I think baseline screening for quantification of coronary calcium is reasonable.  Plan: Coronary calcium score -> depending on these results, we may need to adjust statin dose and consider initiating aspirin. If the score is extremely elevated, we may consider proceeding with Coronary CTA to allow for full anatomical as well as potential ischemic evaluation.

## 2020-02-24 NOTE — Assessment & Plan Note (Signed)
He does have documented chronic bronchitis and emphysema on chest  CT scan, but is minimally symptomatic.  Long-term smoker successfully quit with Chantix years ago. He is on Advair, but has not required rescue inhaler.

## 2020-02-25 DIAGNOSIS — M545 Low back pain, unspecified: Secondary | ICD-10-CM | POA: Diagnosis not present

## 2020-02-25 DIAGNOSIS — M7130 Other bursal cyst, unspecified site: Secondary | ICD-10-CM | POA: Diagnosis not present

## 2020-03-07 ENCOUNTER — Other Ambulatory Visit: Payer: Self-pay

## 2020-03-07 ENCOUNTER — Ambulatory Visit (INDEPENDENT_AMBULATORY_CARE_PROVIDER_SITE_OTHER)
Admission: RE | Admit: 2020-03-07 | Discharge: 2020-03-07 | Disposition: A | Discharge status: Home / Self Care | Payer: Self-pay | Source: Ambulatory Visit | Attending: Cardiology | Admitting: Cardiology

## 2020-03-07 DIAGNOSIS — I2510 Atherosclerotic heart disease of native coronary artery without angina pectoris: Secondary | ICD-10-CM

## 2020-03-22 DIAGNOSIS — B353 Tinea pedis: Secondary | ICD-10-CM | POA: Diagnosis not present

## 2020-03-22 DIAGNOSIS — B351 Tinea unguium: Secondary | ICD-10-CM | POA: Diagnosis not present

## 2020-03-22 DIAGNOSIS — L814 Other melanin hyperpigmentation: Secondary | ICD-10-CM | POA: Diagnosis not present

## 2020-03-22 DIAGNOSIS — L820 Inflamed seborrheic keratosis: Secondary | ICD-10-CM | POA: Diagnosis not present

## 2020-03-22 DIAGNOSIS — D1801 Hemangioma of skin and subcutaneous tissue: Secondary | ICD-10-CM | POA: Diagnosis not present

## 2020-03-22 DIAGNOSIS — L821 Other seborrheic keratosis: Secondary | ICD-10-CM | POA: Diagnosis not present

## 2020-03-22 DIAGNOSIS — B078 Other viral warts: Secondary | ICD-10-CM | POA: Diagnosis not present

## 2020-03-23 DIAGNOSIS — E785 Hyperlipidemia, unspecified: Secondary | ICD-10-CM | POA: Diagnosis not present

## 2020-03-24 DIAGNOSIS — E785 Hyperlipidemia, unspecified: Secondary | ICD-10-CM | POA: Diagnosis not present

## 2020-03-24 DIAGNOSIS — F339 Major depressive disorder, recurrent, unspecified: Secondary | ICD-10-CM | POA: Diagnosis not present

## 2020-03-24 DIAGNOSIS — I2510 Atherosclerotic heart disease of native coronary artery without angina pectoris: Secondary | ICD-10-CM | POA: Diagnosis not present

## 2020-03-24 DIAGNOSIS — F5104 Psychophysiologic insomnia: Secondary | ICD-10-CM | POA: Diagnosis not present

## 2020-03-29 ENCOUNTER — Other Ambulatory Visit: Payer: Self-pay

## 2020-03-29 ENCOUNTER — Encounter: Payer: Self-pay | Admitting: Cardiology

## 2020-03-29 ENCOUNTER — Ambulatory Visit (INDEPENDENT_AMBULATORY_CARE_PROVIDER_SITE_OTHER): Payer: PPO | Admitting: Cardiology

## 2020-03-29 VITALS — BP 143/87 | HR 74 | Ht 73.0 in | Wt 231.8 lb

## 2020-03-29 DIAGNOSIS — M7130 Other bursal cyst, unspecified site: Secondary | ICD-10-CM | POA: Diagnosis not present

## 2020-03-29 DIAGNOSIS — R030 Elevated blood-pressure reading, without diagnosis of hypertension: Secondary | ICD-10-CM | POA: Diagnosis not present

## 2020-03-29 DIAGNOSIS — M48062 Spinal stenosis, lumbar region with neurogenic claudication: Secondary | ICD-10-CM | POA: Diagnosis not present

## 2020-03-29 DIAGNOSIS — E782 Mixed hyperlipidemia: Secondary | ICD-10-CM | POA: Diagnosis not present

## 2020-03-29 DIAGNOSIS — I2510 Atherosclerotic heart disease of native coronary artery without angina pectoris: Secondary | ICD-10-CM

## 2020-03-29 DIAGNOSIS — Z6831 Body mass index (BMI) 31.0-31.9, adult: Secondary | ICD-10-CM | POA: Diagnosis not present

## 2020-03-29 MED ORDER — EZETIMIBE 10 MG PO TABS: 10.0000 mg | ORAL_TABLET | Freq: Every day | ORAL | 3 refills | Status: DC

## 2020-03-29 NOTE — Patient Instructions (Addendum)
Medication Instructions:  Increase to Zetia  10 mg daily    *If you need a refill on your cardiac medications before your next appointment, please call your pharmacy*   Lab Work:  Not needed   Testing/Procedures: Not needed   Follow-Up: At [facility], you and your health needs are our priority.  As part of our continuing mission to provide you with exceptional heart care, we have created designated Provider Care Teams.  These Care Teams include your primary Cardiologist (physician) and Advanced Practice Providers (APPs -  Physician Assistants and Nurse Practitioners) who all work together to provide you with the care you need, when you need it.     Your next appointment:   12 month(s)  The format for your next appointment:   In Person  Provider:   Glenetta Hew, MD   Other Instructions   blood pressure log- over the next  2 weeks  And twice a day for one week  Leading up to seeing primary  And take reading with you to the visit

## 2020-03-29 NOTE — Progress Notes (Signed)
Primary Care Provider: Deland Pretty, MD Cardiologist: Glenetta Hew, MD Electrophysiologist: None  Clinic Note: Chief Complaint  Patient presents with  . Follow-up    Coronary calcium score reviewed   ===================================  ASSESSMENT/PLAN   Problem List Items Addressed This Visit    Elevated blood pressure reading without diagnosis of hypertension (Chronic)    Current blood pressure somewhat elevated but I think he is somewhat anxious about coming into dogbite the results of the study.  He says at home he usually has pressures in the 130/80 mmHg range. Plan: I have asked that he maintain a blood pressure log over the next few months in order to determine what his pressures truly are doing.  If they are averaging above 135/85 million mercury, then we may need to consider treatment.    I well defer to PCP who will see him more frequently.      Hyperlipemia, mixed (Chronic)    He was converted to rosuvastatin from atorvastatin by his PCP.  I am increasing Zetia the full 10 mg daily for full effect.  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  This is not obstructive.  This is all still primary prevention.      Relevant Medications   aspirin 81 MG chewable tablet   ezetimibe (ZETIA) 10 MG tablet   Coronary artery calcification seen on CAT scan - Primary (Chronic)    Intermediate risk coronary calcium score of 250.  No active symptoms and a relatively active gentleman. Recommendation is aggressive risk factor modification and monitoring for symptoms.  Plan:  He has quit smoking,  Lipids look fairly well controlled, but could be better => increase up to Zetia 10 mg along with rosuvastatin 20 mg.  Not unreasonable to be on aspirin based on three-vessel calcification noted.  Blood pressure has not been an issue.  Currently he is borderline--blood pressure log.  Target blood pressure <135/85 mmHg. ->   Low threshold to consider initiating therapy, best first option would probably still be ACE inhibitor/ARB plus/minus diuretic.       Relevant Medications   aspirin 81 MG chewable tablet   ezetimibe (ZETIA) 10 MG tablet     ===================================  HPI:    Patrick Craig "Rush Landmark" is a 70 y.o. 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  Patrick Craig was initially seen at the request of of Deland Pretty, MD on February 22, 2020 in response to a CT scan performed at [facility] showing three-vessel coronary atherosclerosis as well as aortic atherosclerosis.  (This is a similar finding for the last 3 years).  Dr. Shelia Media had switched him from atorvastatin to rosuvastatin and started aspirin 81 mg daily.  He seemed to be tolerating that change without any issues.  => "Rush Landmark" indicated that he is relatively active, and has denied any major symptoms.  He does at least 20 minutes 4 days a week on the [place] track and walks about 2 miles every other day.  He only notes exertional dyspnea when he finishes climbing up to the top of the hill but no more than what would be expected with that level of exertion.  No chest pain or  pressure.  He has occasional shooting pain down his left leg from the hip to the calf.  It is usually alleviated with stretching the muscle. -> He did describe an episode last summer where he was attempting to swim to sure from his boat out on [facility], and after swim part of the way he started having a panic attack and he realized he could not touch the ground.  He got very anxious, starting to scream for help.  With that he felt extremely dyspneic and some discomfort in his chest.  He went into the floating position on  his back.  Once help arrived, he was fine.  Recent Hospitalizations: None  Reviewed  CV studies:    The following studies were reviewed today: (if available, images/films reviewed: From Epic Chart or Care Everywhere) . Chest CT 11/26/2019: Lung RADS 2.  Benign appearance.  Moderate centrilobular emphysema with diffuse bronchial thickening (chronic bronchitis).  Three-vessel coronary atherosclerosis and aortic atherosclerosis . Coronary Calcium Score 03/07/2020: Calcium score is 250 At this level, the recommendation is aggressive risk factor modification which means blood pressure, cholesterol and diabetes control if any of the 3 are present. Smoking cessation if necessary. Increasing activity levels, and monitoring for symptoms.  I do not think is necessary to do a stress test or coronary CT angiogram at this time  Interval History:   Patrick Craig is here today for follow-up of coronary calcium score.  He says that he really has not had any change to his routine except for the last couple days he was dealing with a cold type symptom so for the last 4 days he had not been doing his [place] track.  However now that he got better he went out for a 4 mile walk over the weekend.  Now that he feels better, he is back on the [place] track 3 days a week. He really pretty much has a same level of symptoms that he had last visit.  No real change.  No heart failure symptoms of PND, orthopnea edema, no palpitations.  CV Review of Symptoms (Summary) Cardiovascular ROS: no chest pain or dyspnea on exertion positive for - Exercise dyspnea at peak exercise-going up a hill or at max speed on the [place] track. negative for - edema, irregular heartbeat, orthopnea, palpitations, paroxysmal nocturnal dyspnea, rapid heart rate, shortness of breath or Syncope/near syncope or TIA/amaurosis fugax, claudication  The patient DOES NOT have symptoms concerning for COVID-19 infection (fever, chills, cough, or new  shortness of breath).   REVIEWED OF SYSTEMS   Review of Systems  Constitutional: Negative for malaise/fatigue and weight loss.  HENT: Negative for congestion, nosebleeds and sinus pain.   Respiratory: Positive for cough (He rarely has the smoker's cough in the morning, more related to allergies.).   Cardiovascular: Negative for leg swelling.  Gastrointestinal: Negative for blood in stool and melena.  Genitourinary: Negative for hematuria.  Musculoskeletal: Negative for falls, joint pain and myalgias.  Neurological: Positive for tingling (He has what sounds like radicular pain that goes from left hip down to his left calf with walking.). Negative for dizziness and focal weakness.  Psychiatric/Behavioral: Positive for depression (Very well controlled.). Negative for memory loss. The patient is not nervous/anxious and does not have insomnia.        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    I have reviewed and (if needed) personally updated the patient's  problem list, medications, allergies, past medical and surgical history, social and family history.   PAST MEDICAL HISTORY   Past Medical History:  Diagnosis Date  . Cellulitis of arm, left 08/06/2014  . Chronic bronchitis with emphysema ([place]) 10/2019   CT chest:  Lung RADS 2.  Benign appearance.  Moderate centrilobular emphysema with diffuse bronchial thickening (chronic bronchitis).  . Coronary artery calcification seen on CAT scan 10/2019   Chest CT 11/26/2019:  Three-vessel coronary atherosclerosis and aortic atherosclerosis; 03/07/2020: Coronary Calcium Score 250.  Marland Kitchen Depression   . Erectile dysfunction   . History of kidney stones    "30 years ago"  . Hyperlipemia    Recently started on statin  . Rosacea   . Sleep apnea   . Thoracic aorta atherosclerosis ([place])    Seen on CT scan  . Vitamin D deficiency     PAST SURGICAL HISTORY   Past Surgical History:   Procedure Laterality Date  . APPENDECTOMY  1983  . HERNIA REPAIR Left 2000   inguinal  . INCISION AND DRAINAGE ABSCESS    . KNEE ARTHROSCOPY Left    Medial meniscus  . LUMBAR LAMINECTOMY/DECOMPRESSION MICRODISCECTOMY Right 05/30/2018   Procedure: Right Lumbar Three-Four Laminectomy for synovial cyst;  Surgeon: Kary Kos, MD;  Location: [facility];  Service: Neurosurgery;  Laterality: Right;  posterior  . SHOULDER SURGERY Bilateral    Clavicle surgeries    Immunization History  Administered Date(s) Administered  . Influenza-Unspecified 02/10/2015, 11/19/2019  . Pneumococcal Conjugate-13 11/05/2016, 02/09/2020  . Tdap 11/23/2013  . Zoster 11/05/2016  . Zoster Recombinat (Shingrix) 11/07/2017, 05/25/2018    MEDICATIONS/ALLERGIES   Current Meds  Medication Sig  . aspirin 81 MG chewable tablet Chew 81 mg by mouth daily.  Marland Kitchen buPROPion (WELLBUTRIN XL) 300 MG 24 hr tablet Take 300 mg by mouth every other day.  . cholecalciferol (VITAMIN D3) 25 MCG (1000 UT) tablet Take 1,000 Units by mouth.   . ezetimibe (ZETIA) 10 MG tablet Take 1 tablet (10 mg total) by mouth daily.  . Fluticasone-Salmeterol (ADVAIR) 250-50 MCG/DOSE AEPB Inhale 1 puff into the lungs daily.   Marland Kitchen guaiFENesin (MUCINEX) 600 MG 12 hr tablet Take 600 mg by mouth daily as needed for cough.  . levocetirizine (XYZAL) 5 MG tablet Take 5 mg by mouth every evening.  . rosuvastatin (CRESTOR) 20 MG tablet Take 20 mg by mouth daily.  . sildenafil (REVATIO) 20 MG tablet Take 20 mg by mouth daily as needed (erectile dysfunction).  . traZODone (DESYREL) 50 MG tablet 50 mg at bedtime as needed for sleep.  . [DISCONTINUED] ezetimibe (ZETIA) 10 MG tablet Take 10 mg by mouth daily.    Allergies  Allergen Reactions  . Erythromycin Base Other (See Comments)    GI Upset (intolerance)  . Penicillins Other (See Comments)    Unknown reaction, was told as a child Did it involve swelling of the face/tongue/throat, SOB, or low BP? Unknown Did it  involve sudden or severe rash/hives, skin peeling, or any reaction on the inside of your mouth or nose? Unknown Did you need to seek medical attention at a hospital or doctor's office? Unknown When did it last happen?Childhood If all above answers are "NO", may proceed with cephalosporin use.      SOCIAL HISTORY/FAMILY HISTORY   Reviewed in Epic:  Pertinent findings:  Social History   Tobacco Use  . Smoking status: Former Smoker    Types: Cigarettes    Quit date: 05/26/2008    Years  since quitting: 11.9  . Smokeless tobacco: Never Used  Vaping Use  . Vaping Use: Never used  Substance Use Topics  . Alcohol use: Yes    Comment: 1-2 drinks every 1-2 days  . Drug use: Never   Social History   Social History Narrative   He is a married father of 1 daughter (who lives in [place], [place]), and grandfather 3.   He lives with his wife.   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  He also uses a [place] track 4-5 days a week.   Walking walks with his wife at least 2 times a week.   Family History  Problem Relation Age of Onset  . Arthritis Mother   . Failure to thrive Father 95  . Arthritis Father   . Down syndrome Brother   . Colon cancer Brother 49  . Healthy Daughter     OBJCTIVE -PE, EKG, labs   Wt Readings from Last 3 Encounters:  03/29/20 231 lb 12.8 oz (105.1 kg)  02/22/20 234 lb 6.4 oz (106.3 kg)  01/10/20 225 lb (102.1 kg)    Physical Exam: BP (!) 143/87   Pulse 74   Ht 6\' 1"  (1.854 m)   Wt 231 lb 12.8 oz (105.1 kg)   SpO2 93%   BMI 30.58 kg/m  Physical Exam Vitals reviewed.  Constitutional:      General: He is not in acute distress.    Appearance: Normal appearance. He is obese. He is not ill-appearing or toxic-appearing.     Comments: Healthy-appearing.  Well-groomed.  HENT:     Head: Normocephalic and atraumatic.  Neck:     Vascular: No carotid bruit, hepatojugular reflux  or JVD.  Cardiovascular:     Rate and Rhythm: Normal rate and regular rhythm.  No extrasystoles are present.    Chest Wall: PMI is not displaced (Difficult to assess).     Pulses: Intact distal pulses.     Heart sounds: S2 normal. Heart sounds are distant. No murmur heard. No friction rub. No gallop.   Pulmonary:     Effort: Pulmonary effort is normal. No respiratory distress.     Breath sounds: Normal breath sounds. No wheezing, rhonchi or rales.  Chest:     Chest wall: No tenderness.  Abdominal:     Comments: Mild truncal obesity  Musculoskeletal:        General: No swelling. Normal range of motion.     Cervical back: Normal range of motion and neck supple.  Skin:    General: Skin is warm and dry.  Neurological:     General: No focal deficit present.     Mental Status: He is alert and oriented to person, place, and time.     Cranial Nerves: No cranial nerve deficit.     Gait: Gait normal.  Psychiatric:        Mood and Affect: Mood normal.        Behavior: Behavior normal.        Thought Content: Thought content normal.        Judgment: Judgment normal.      Adult ECG Report Not checked  Recent Labs:   No new labs  02/02/2020: TC 184, TG 103, HDL 56, LDL 109 (was converted to rosuvastatin from atorvastatin); No results found for: CHOL, HDL, LDLCALC, LDLDIRECT, TRIG, CHOLHDL Lab Results  Component Value  Date   CREATININE 1.08 05/22/2018   BUN 21 05/22/2018   NA 139 05/22/2018   K 4.4 05/22/2018   CL 107 05/22/2018   CO2 25 05/22/2018   CBC Latest Ref Rng & Units 05/22/2018  WBC 4.0 - 10.5 K/uL 5.2  Hemoglobin 13.0 - 17.0 g/dL 15.0  Hematocrit 39.0 - 52.0 % 44.0  Platelets 150 - 400 K/uL 184    No results found for: TSH  ==================================================  COVID-19 Education: The signs and symptoms of COVID-19 were discussed with the patient and how to seek care for testing (follow up with PCP or arrange E-visit).   The importance of social  distancing and COVID-19 vaccination was discussed today. The patient is practicing social distancing & Masking.   I spent a total of 19 minutes with the patient spent in direct patient consultation.  Additional time spent with chart review  / charting (studies, outside notes, etc): 15 min ->  Total Time: 34 min  Current medicines are reviewed at length with the patient today.  (+/- concerns) none  This visit occurred during the SARS-CoV-2 public health emergency.  Safety protocols were in place, including screening questions prior to the visit, additional usage of staff PPE, and extensive cleaning of exam room while observing appropriate contact time as indicated for disinfecting solutions.  Notice: This dictation was prepared with Dragon dictation along with smaller phrase technology. Any transcriptional errors that result from this process are unintentional and may not be corrected upon review.  Patient Instructions / Medication Changes & Studies & Tests Ordered   Patient Instructions  Medication Instructions:  Increase to Zetia  10 mg daily    *If you need a refill on your cardiac medications before your next appointment, please call your pharmacy*   Lab Work:  Not needed   Testing/Procedures: Not needed   Follow-Up: At [facility], you and your health needs are our priority.  As part of our continuing mission to provide you with exceptional heart care, we have created designated Provider Care Teams.  These Care Teams include your primary Cardiologist (physician) and Advanced Practice Providers (APPs -  Physician Assistants and Nurse Practitioners) who all work together to provide you with the care you need, when you need it.     Your next appointment:   12 month(s)  The format for your next appointment:   In Person  Provider:   Glenetta Hew, MD   Other Instructions   blood pressure log- over the next  2 weeks  And twice a day for one week  Leading up to seeing  primary  And take reading with you to the visit      Studies Ordered:   No orders of the defined types were placed in this encounter.    Glenetta Hew, M.D., M.S. Interventional Cardiologist   Pager # [phone] Phone # [phone] [place], [place] [address]   Thank you for choosing Heartcare at [facility]!!

## 2020-04-04 DIAGNOSIS — H40023 Open angle with borderline findings, high risk, bilateral: Secondary | ICD-10-CM | POA: Diagnosis not present

## 2020-04-04 DIAGNOSIS — H5213 Myopia, bilateral: Secondary | ICD-10-CM | POA: Diagnosis not present

## 2020-04-18 DIAGNOSIS — M48062 Spinal stenosis, lumbar region with neurogenic claudication: Secondary | ICD-10-CM | POA: Diagnosis not present

## 2020-04-18 DIAGNOSIS — M79671 Pain in right foot: Secondary | ICD-10-CM | POA: Diagnosis not present

## 2020-04-18 DIAGNOSIS — M25552 Pain in left hip: Secondary | ICD-10-CM | POA: Diagnosis not present

## 2020-04-20 DIAGNOSIS — M48062 Spinal stenosis, lumbar region with neurogenic claudication: Secondary | ICD-10-CM | POA: Diagnosis not present

## 2020-04-20 DIAGNOSIS — M79671 Pain in right foot: Secondary | ICD-10-CM | POA: Diagnosis not present

## 2020-04-20 DIAGNOSIS — M25552 Pain in left hip: Secondary | ICD-10-CM | POA: Diagnosis not present

## 2020-04-24 ENCOUNTER — Encounter: Payer: Self-pay | Admitting: Cardiology

## 2020-04-24 DIAGNOSIS — R030 Elevated blood-pressure reading, without diagnosis of hypertension: Secondary | ICD-10-CM | POA: Insufficient documentation

## 2020-04-24 NOTE — Assessment & Plan Note (Signed)
He was converted to rosuvastatin from atorvastatin by his PCP.  I am increasing Zetia the full 10 mg daily for full effect.  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  This is not obstructive.  This is all still primary prevention.

## 2020-04-24 NOTE — Assessment & Plan Note (Signed)
Current blood pressure somewhat elevated but I think he is somewhat anxious about coming into dogbite the results of the study.  He says at home he usually has pressures in the 130/80 mmHg range. Plan: I have asked that he maintain a blood pressure log over the next few months in order to determine what his pressures truly are doing.  If they are averaging above 135/85 million mercury, then we may need to consider treatment.    I well defer to PCP who will see him more frequently.

## 2020-04-24 NOTE — Assessment & Plan Note (Signed)
Intermediate risk coronary calcium score of 250.  No active symptoms and a relatively active gentleman. Recommendation is aggressive risk factor modification and monitoring for symptoms.  Plan:  He has quit smoking,  Lipids look fairly well controlled, but could be better => increase up to Zetia 10 mg along with rosuvastatin 20 mg.  Not unreasonable to be on aspirin based on three-vessel calcification noted.  Blood pressure has not been an issue.  Currently he is borderline--blood pressure log.  Target blood pressure <135/85 mmHg. ->  Low threshold to consider initiating therapy, best first option would probably still be ACE inhibitor/ARB plus/minus diuretic.

## 2020-04-26 DIAGNOSIS — M48062 Spinal stenosis, lumbar region with neurogenic claudication: Secondary | ICD-10-CM | POA: Diagnosis not present

## 2020-05-09 DIAGNOSIS — F339 Major depressive disorder, recurrent, unspecified: Secondary | ICD-10-CM | POA: Diagnosis not present

## 2020-05-09 DIAGNOSIS — R110 Nausea: Secondary | ICD-10-CM | POA: Diagnosis not present

## 2020-06-10 ENCOUNTER — Other Ambulatory Visit: Payer: Self-pay | Admitting: Physician Assistant

## 2020-06-10 DIAGNOSIS — K2100 Gastro-esophageal reflux disease with esophagitis, without bleeding: Secondary | ICD-10-CM | POA: Diagnosis not present

## 2020-06-10 DIAGNOSIS — Z800 Family history of malignant neoplasm of digestive organs: Secondary | ICD-10-CM | POA: Diagnosis not present

## 2020-06-10 DIAGNOSIS — K5790 Diverticulosis of intestine, part unspecified, without perforation or abscess without bleeding: Secondary | ICD-10-CM | POA: Diagnosis not present

## 2020-06-21 DIAGNOSIS — E785 Hyperlipidemia, unspecified: Secondary | ICD-10-CM | POA: Diagnosis not present

## 2020-06-23 DIAGNOSIS — E785 Hyperlipidemia, unspecified: Secondary | ICD-10-CM | POA: Diagnosis not present

## 2020-06-23 DIAGNOSIS — M7541 Impingement syndrome of right shoulder: Secondary | ICD-10-CM | POA: Diagnosis not present

## 2020-06-28 DIAGNOSIS — M48062 Spinal stenosis, lumbar region with neurogenic claudication: Secondary | ICD-10-CM | POA: Diagnosis not present

## 2020-06-30 ENCOUNTER — Ambulatory Visit
Admission: RE | Admit: 2020-06-30 | Discharge: 2020-06-30 | Disposition: A | Discharge status: Home / Self Care | Payer: PPO | Source: Ambulatory Visit | Attending: Physician Assistant | Admitting: Physician Assistant

## 2020-06-30 DIAGNOSIS — K219 Gastro-esophageal reflux disease without esophagitis: Secondary | ICD-10-CM | POA: Diagnosis not present

## 2020-06-30 DIAGNOSIS — K2100 Gastro-esophageal reflux disease with esophagitis, without bleeding: Secondary | ICD-10-CM

## 2020-06-30 DIAGNOSIS — K7689 Other specified diseases of liver: Secondary | ICD-10-CM | POA: Diagnosis not present

## 2020-07-07 DIAGNOSIS — M48062 Spinal stenosis, lumbar region with neurogenic claudication: Secondary | ICD-10-CM | POA: Diagnosis not present

## 2020-09-12 DIAGNOSIS — K319 Disease of stomach and duodenum, unspecified: Secondary | ICD-10-CM | POA: Diagnosis not present

## 2020-09-12 DIAGNOSIS — K219 Gastro-esophageal reflux disease without esophagitis: Secondary | ICD-10-CM | POA: Diagnosis not present

## 2020-09-12 DIAGNOSIS — K3189 Other diseases of stomach and duodenum: Secondary | ICD-10-CM | POA: Diagnosis not present

## 2020-09-12 DIAGNOSIS — R12 Heartburn: Secondary | ICD-10-CM | POA: Diagnosis not present

## 2020-09-12 DIAGNOSIS — K2900 Acute gastritis without bleeding: Secondary | ICD-10-CM | POA: Diagnosis not present

## 2020-09-15 DIAGNOSIS — K319 Disease of stomach and duodenum, unspecified: Secondary | ICD-10-CM | POA: Diagnosis not present

## 2020-12-01 DIAGNOSIS — R1084 Generalized abdominal pain: Secondary | ICD-10-CM | POA: Diagnosis not present

## 2020-12-02 DIAGNOSIS — R1012 Left upper quadrant pain: Secondary | ICD-10-CM | POA: Diagnosis not present

## 2020-12-02 DIAGNOSIS — Z87891 Personal history of nicotine dependence: Secondary | ICD-10-CM | POA: Diagnosis not present

## 2020-12-02 DIAGNOSIS — J449 Chronic obstructive pulmonary disease, unspecified: Secondary | ICD-10-CM | POA: Diagnosis not present

## 2020-12-05 DIAGNOSIS — Z87891 Personal history of nicotine dependence: Secondary | ICD-10-CM | POA: Diagnosis not present

## 2021-01-26 DIAGNOSIS — R1084 Generalized abdominal pain: Secondary | ICD-10-CM | POA: Diagnosis not present

## 2021-01-26 DIAGNOSIS — R109 Unspecified abdominal pain: Secondary | ICD-10-CM | POA: Diagnosis not present

## 2021-01-26 DIAGNOSIS — R110 Nausea: Secondary | ICD-10-CM | POA: Diagnosis not present

## 2021-02-09 DIAGNOSIS — Z125 Encounter for screening for malignant neoplasm of prostate: Secondary | ICD-10-CM | POA: Diagnosis not present

## 2021-02-09 DIAGNOSIS — E785 Hyperlipidemia, unspecified: Secondary | ICD-10-CM | POA: Diagnosis not present

## 2021-02-14 DIAGNOSIS — Z0001 Encounter for general adult medical examination with abnormal findings: Secondary | ICD-10-CM | POA: Diagnosis not present

## 2021-02-14 DIAGNOSIS — J42 Unspecified chronic bronchitis: Secondary | ICD-10-CM | POA: Diagnosis not present

## 2021-02-14 DIAGNOSIS — M7130 Other bursal cyst, unspecified site: Secondary | ICD-10-CM | POA: Diagnosis not present

## 2021-02-14 DIAGNOSIS — N400 Enlarged prostate without lower urinary tract symptoms: Secondary | ICD-10-CM | POA: Diagnosis not present

## 2021-02-14 DIAGNOSIS — R110 Nausea: Secondary | ICD-10-CM | POA: Diagnosis not present

## 2021-02-14 DIAGNOSIS — M5441 Lumbago with sciatica, right side: Secondary | ICD-10-CM | POA: Diagnosis not present

## 2021-02-14 DIAGNOSIS — D72819 Decreased white blood cell count, unspecified: Secondary | ICD-10-CM | POA: Diagnosis not present

## 2021-02-14 DIAGNOSIS — J439 Emphysema, unspecified: Secondary | ICD-10-CM | POA: Diagnosis not present

## 2021-02-14 DIAGNOSIS — F339 Major depressive disorder, recurrent, unspecified: Secondary | ICD-10-CM | POA: Diagnosis not present

## 2021-02-14 DIAGNOSIS — I2510 Atherosclerotic heart disease of native coronary artery without angina pectoris: Secondary | ICD-10-CM | POA: Diagnosis not present

## 2021-02-14 DIAGNOSIS — I700 Atherosclerosis of aorta: Secondary | ICD-10-CM | POA: Diagnosis not present

## 2021-02-14 DIAGNOSIS — I10 Essential (primary) hypertension: Secondary | ICD-10-CM | POA: Diagnosis not present

## 2021-02-16 ENCOUNTER — Ambulatory Visit: Payer: PPO | Admitting: Family Medicine

## 2021-02-16 NOTE — Progress Notes (Deleted)
° ° °  Subjective:    CC: Low back pain w/ R LE sciatica  I, Wiatt Mahabir, LAT, ATC, am serving as scribe for Dr. Lynne Leader.  HPI: Pt is a 71 y/o male presenting w/ c/o chronic low back pain and R LE sciatica.  He has been previously seen by neurosurgery and had a prior ESI on 07/07/20.  Today, pt reports  Radiating pain: R LE numbness and tingling: Aggravating factors: Treatments tried:lumbar ESI on 07/07/20;  Diagnostic testing: L-spine XR- 08/22/19, 05/30/18  Pertinent review of Systems: ***  Relevant historical information: ***   Objective:   There were no vitals filed for this visit. General: Well Developed, well nourished, and in no acute distress.   MSK: ***  Lab and Radiology Results No results found for this or any previous visit (from the past 72 hour(s)). No results found.    Impression and Recommendations:    Assessment and Plan: 71 y.o. male with ***.  PDMP not reviewed this encounter. No orders of the defined types were placed in this encounter.  No orders of the defined types were placed in this encounter.   Discussed warning signs or symptoms. Please see discharge instructions. Patient expresses understanding.   ***

## 2021-02-17 ENCOUNTER — Ambulatory Visit: Payer: PPO | Admitting: Family Medicine

## 2021-02-17 ENCOUNTER — Other Ambulatory Visit: Payer: Self-pay

## 2021-02-17 ENCOUNTER — Ambulatory Visit (INDEPENDENT_AMBULATORY_CARE_PROVIDER_SITE_OTHER): Payer: PPO

## 2021-02-17 VITALS — BP 140/86 | HR 72 | Ht 73.0 in | Wt 228.0 lb

## 2021-02-17 DIAGNOSIS — M71349 Other bursal cyst, unspecified hand: Secondary | ICD-10-CM | POA: Diagnosis not present

## 2021-02-17 DIAGNOSIS — J439 Emphysema, unspecified: Secondary | ICD-10-CM | POA: Diagnosis not present

## 2021-02-17 DIAGNOSIS — G8929 Other chronic pain: Secondary | ICD-10-CM

## 2021-02-17 DIAGNOSIS — F339 Major depressive disorder, recurrent, unspecified: Secondary | ICD-10-CM | POA: Diagnosis not present

## 2021-02-17 DIAGNOSIS — M79642 Pain in left hand: Secondary | ICD-10-CM | POA: Diagnosis not present

## 2021-02-17 DIAGNOSIS — M2578 Osteophyte, vertebrae: Secondary | ICD-10-CM | POA: Diagnosis not present

## 2021-02-17 DIAGNOSIS — M542 Cervicalgia: Secondary | ICD-10-CM | POA: Diagnosis not present

## 2021-02-17 DIAGNOSIS — F5104 Psychophysiologic insomnia: Secondary | ICD-10-CM | POA: Diagnosis not present

## 2021-02-17 DIAGNOSIS — I2510 Atherosclerotic heart disease of native coronary artery without angina pectoris: Secondary | ICD-10-CM | POA: Diagnosis not present

## 2021-02-17 DIAGNOSIS — I10 Essential (primary) hypertension: Secondary | ICD-10-CM | POA: Diagnosis not present

## 2021-02-17 DIAGNOSIS — M5441 Lumbago with sciatica, right side: Secondary | ICD-10-CM

## 2021-02-17 DIAGNOSIS — D72819 Decreased white blood cell count, unspecified: Secondary | ICD-10-CM | POA: Diagnosis not present

## 2021-02-17 DIAGNOSIS — M545 Low back pain, unspecified: Secondary | ICD-10-CM | POA: Diagnosis not present

## 2021-02-17 DIAGNOSIS — M1990 Unspecified osteoarthritis, unspecified site: Secondary | ICD-10-CM | POA: Diagnosis not present

## 2021-02-17 MED ORDER — TIZANIDINE HCL 4 MG PO TABS: 4.0000 mg | ORAL_TABLET | Freq: Three times a day (TID) | ORAL | 1 refills | Status: DC | PRN

## 2021-02-17 NOTE — Patient Instructions (Addendum)
Nice to meet you.  I've ordered a lumbar epidural injection.  [facility] will call you to schedule but if you haven't heard from them in on week, regarding scheduling, please call [phone] or [phone] to schedule.  PT to [facility] for neck pain.  Their office will call you to schedule but please let us know if you have not heard from them in one week regarding scheduling.  Please get an Xray today before you leave.  Follow-up: 6 weeks

## 2021-02-17 NOTE — Progress Notes (Signed)
I, Patrick Craig, LAT, ATC acting as a scribe for Patrick Leader, MD.  Subjective:    CC: LBP  HPI: Pt is a 71 y/o male c/o LBP x 6 months. Pt has a hx of l-spine surgery to remove a synovial cyst (May 2020) and hx of lumbar stenosis. Pt locates pain to R buttock w/ radiating pain distally along the posterior aspect of the R thigh.  Radiating pain: yes LE numbness/tingling: no LE weakness: no Aggravates: sitting- esp in the car,  Treatments tried: IBU, HEP  Pt also c/o neck pain ongoing for the past 3 days. Pt locates pain to the R side of his c-spine. Pt is limited in in R-sided cervical rotation. Pt has tried self-massage, heat, ice. No UE numnbess/tingling noted.  Dx imaging:L-spine MRI- 02/25/20 w/ Dr. Saintclair Halsted; 08/22/19 L-spine XR  Pertinent review of Systems: No fevers or chills  Relevant historical information: Prior synovial cyst removal by Dr. Saintclair Halsted at L3-4 left side   Objective:    Vitals:   02/17/21 1113  BP: 140/86  Pulse: 72  SpO2: 97%   General: Well Developed, well nourished, and in no acute distress.   MSK:  C-spine: Normal-appearing Nontender midline. Tender palpation right cervical paraspinal musculature. Decreased cervical motion. Upper extremity strength reflexes and sensation are equal and normal throughout. L-spine nontender midline. Decreased lumbar motion. Lower extremity strength and reflexes and sensation are equal normal throughout.   Lab and Radiology Results X-ray images L-spine obtained today personally and independently interpreted No acute fractures.  Anterior listhesis L4-L5.  Diffuse DDD. Await formal radiology review  EXAM: MRI LUMBAR SPINE WITHOUT AND WITH CONTRAST  TECHNIQUE: Multiplanar and multiecho pulse sequences of the lumbar spine were obtained without and with intravenous contrast.  COMPARISON: MRI of the lumbar spine January 06, 2018.  FINDINGS: Segmentation: Standard.  Alignment: Small anterolisthesis of L4 over  L5 in minimal retrolisthesis of L1 over L2.  Vertebrae: No fracture, evidence of discitis, or bone lesion. Postsurgical changes from right laminectomy at L3-4.  Conus medullaris and cauda equina: Conus extends to the L1 level. Conus and cauda equina appear normal.  Paraspinal and other soft tissues: Postsurgical changes in the posterior soft tissues from right laminectomy with contrast enhancement at the surgical site consistent with postsurgical fibrosis.  Disc levels:  T12-L1: No spinal canal or neural foraminal stenosis.  L1-2: Right asymmetric disc bulge and mild facet degenerative changes resulting in mild right neural foraminal narrowing and mild narrowing the right subarticular zone. No significant spinal canal stenosis.  L2-3: Disc bulge with superimposed small foraminal/far lateral disc protrusion, moderate to advanced facet degenerative changes and ligamentum flavum redundancy resulting in mild spinal canal stenosis with narrowing the bilateral subarticular zones and mild left neural foraminal narrowing.  L3-4: Interval resolution of the right sided synovial cyst. Disc bulge, prominent hypertrophic facet degenerative changes and left ligamentum flavum redundancy resulting in mild-to-moderate spinal canal stenosis with narrowing of the bilateral subarticular zones mild right and severe left neural foraminal narrowing. Findings have progressed since prior MRI.  L4-5: Disc bulge, prominent hypertrophic facet degenerative changes and ligamentum flavum redundancy resulting in severe spinal canal stenosis and moderate left neural foraminal narrowing. Findings are also progressed at this level when compared to prior MRI.  L5-S1: Moderate facet degenerative changes. No significant spinal canal or neural foraminal stenosis.  IMPRESSION: 1. Degenerative changes of the lumbar spine are progressed at the L3-4 and L4-5 levels with severe spinal canal stenosis at L4-5  and moderate  at L3-4. 2. Severe left neural foraminal narrowing at L3-4. 3. Mild spinal canal stenosis at L2-3.   Electronically Signed By: Pedro Earls M.D. On: 02/25/2020 15:53  I, Patrick Craig, personally (independently) visualized and performed the interpretation of the images attached in this note.    Impression and Recommendations:    Assessment and Plan: 71 y.o. male with  Right leg pain thought to be lumbar radiculopathy primarily at L3 or L4 dermatomal pattern.  This is a new issue for him in the setting of prior left-sided lumbar radiculopathy.  His most recent lumbar MRI from January 2022 does show narrowing at neuroforamen that could produce his symptoms today easily.  After discussion we will arrange for an epidural steroid injection with [facility].  If this does not work very well would recommend repeat lumbar MRI.  Right neck pain.  This has been ongoing for about 3 days.  Symptoms most consistent with cervical spasm.  Plan for tizanidine heating pad TENS unit.  Recommend physical therapy.  Recheck back in about 6 weeks.  PDMP not reviewed this encounter. Orders Placed This Encounter  Procedures   DG Lumbar Spine 2-3 Views    Standing Status:   Future    Number of Occurrences:   1    Standing Expiration Date:   03/20/2021    Order Specific Question:   Reason for Exam (SYMPTOM  OR DIAGNOSIS REQUIRED)    Answer:   low back pain    Order Specific Question:   Preferred imaging location?    Answer:   [facility]   DG INJECT DIAG/THERA/INC NEEDLE/CATH/PLC EPI/LUMB/SAC W/IMG    Lumbar epidural for likely R L3 radiculopathy.  Technique and level per radiology.    Standing Status:   Future    Standing Expiration Date:   02/17/2022    Scheduling Instructions:     Lumbar epidural for likely R L3 radiculopathy.  Technique and level per radiology.    Order Specific Question:   Reason for Exam (SYMPTOM  OR DIAGNOSIS REQUIRED)     Answer:   lumbar radiculopathy    Order Specific Question:   Preferred Imaging Location?    Answer:   [id number]   Ambulatory referral to Physical Therapy    Referral Priority:   Routine    Referral Type:   Physical Medicine    Referral Reason:   Specialty Services Required    Requested Specialty:   Physical Therapy    Number of Visits Requested:   1   Meds ordered this encounter  Medications   tiZANidine (ZANAFLEX) 4 MG tablet    Sig: Take 1 tablet (4 mg total) by mouth every 8 (eight) hours as needed for muscle spasms.    Dispense:  30 tablet    Refill:  1    Discussed warning signs or symptoms. Please see discharge instructions. Patient expresses understanding.  The above documentation has been reviewed and is accurate and complete Patrick Craig, M.D.   CC. PCP (Dr Shelia Media) and Dr Saintclair Halsted

## 2021-02-20 ENCOUNTER — Other Ambulatory Visit: Payer: Self-pay

## 2021-02-20 ENCOUNTER — Telehealth: Payer: Self-pay | Admitting: Family Medicine

## 2021-02-20 DIAGNOSIS — G8929 Other chronic pain: Secondary | ICD-10-CM

## 2021-02-20 NOTE — Telephone Encounter (Signed)
Pt referral was re-entered to reflect treatment of LBP. Pt was called and notified. Pt reported the "crick" in his neck was feeling much better. Pt was advised to proceed to PT for LBP. Pt verbalized understanding.

## 2021-02-20 NOTE — Progress Notes (Signed)
Lumbar spine x-ray shows some arthritis changes in the lower portion of the lumbar spine.

## 2021-02-20 NOTE — Telephone Encounter (Signed)
Dr. Georgina Snell stated PT was for his neck and pt was to get lumbar ESI for his low back but will confirm and contact pt back.

## 2021-02-20 NOTE — Telephone Encounter (Signed)
Patient called asking if the referral to PT was also supposed to include his back?  Please advise.

## 2021-02-23 ENCOUNTER — Other Ambulatory Visit: Payer: Self-pay

## 2021-02-23 ENCOUNTER — Other Ambulatory Visit: Payer: PPO

## 2021-02-23 ENCOUNTER — Ambulatory Visit
Admission: RE | Admit: 2021-02-23 | Discharge: 2021-02-23 | Disposition: A | Discharge status: Home / Self Care | Payer: PPO | Source: Ambulatory Visit | Attending: Family Medicine | Admitting: Family Medicine

## 2021-02-23 DIAGNOSIS — G8929 Other chronic pain: Secondary | ICD-10-CM

## 2021-02-23 DIAGNOSIS — M4727 Other spondylosis with radiculopathy, lumbosacral region: Secondary | ICD-10-CM | POA: Diagnosis not present

## 2021-02-23 MED ORDER — IOPAMIDOL (ISOVUE-M 200) INJECTION 41%
10.0000 mL | Freq: Once | INTRAMUSCULAR | Status: AC
  Administered 2021-02-23: 10 mL via EPIDURAL

## 2021-02-23 MED ORDER — METHYLPREDNISOLONE ACETATE 40 MG/ML INJ SUSP (RADIOLOG
80.0000 mg | Freq: Once | INTRAMUSCULAR | Status: AC
  Administered 2021-02-23: 80 mg via EPIDURAL

## 2021-02-23 NOTE — Discharge Instructions (Signed)

## 2021-03-01 ENCOUNTER — Ambulatory Visit: Payer: PPO | Attending: Family Medicine | Admitting: Physical Therapy

## 2021-03-01 ENCOUNTER — Other Ambulatory Visit: Payer: Self-pay

## 2021-03-01 ENCOUNTER — Encounter: Payer: Self-pay | Admitting: Physical Therapy

## 2021-03-01 DIAGNOSIS — R262 Difficulty in walking, not elsewhere classified: Secondary | ICD-10-CM | POA: Insufficient documentation

## 2021-03-01 DIAGNOSIS — R2681 Unsteadiness on feet: Secondary | ICD-10-CM | POA: Diagnosis not present

## 2021-03-01 DIAGNOSIS — M542 Cervicalgia: Secondary | ICD-10-CM | POA: Diagnosis not present

## 2021-03-01 DIAGNOSIS — M6281 Muscle weakness (generalized): Secondary | ICD-10-CM | POA: Insufficient documentation

## 2021-03-01 DIAGNOSIS — M5416 Radiculopathy, lumbar region: Secondary | ICD-10-CM | POA: Insufficient documentation

## 2021-03-01 NOTE — Therapy (Signed)
[place]. [place], [place], [address] Phone: [phone]   Fax:  [phone]  Physical Therapy Evaluation  Patient Details  Name: Patrick Craig MRN: [id number] Date of Birth: 08-Oct-1950 Referring Provider (PT): Marikay Alar Date: 03/01/2021   PT End of Session - 03/01/21 1142     Visit Number 1    Number of Visits 16    Date for PT Re-Evaluation 04/26/21    PT Start Time 0933    PT Stop Time 1013    PT Time Calculation (min) 40 min    Activity Tolerance Patient tolerated treatment well    Behavior During Therapy [facility] for tasks assessed/performed             Past Medical History:  Diagnosis Date   Cellulitis of arm, left 08/06/2014   Chronic bronchitis with emphysema ([place]) 10/2019   CT chest:  Lung RADS 2.  Benign appearance.  Moderate centrilobular emphysema with diffuse bronchial thickening (chronic bronchitis).   Coronary artery calcification seen on CAT scan 10/2019   Chest CT 11/26/2019:  Three-vessel coronary atherosclerosis and aortic atherosclerosis; 03/07/2020: Coronary Calcium Score 250.   Depression    Erectile dysfunction    History of kidney stones    "30 years ago"   Hyperlipemia    Recently started on statin   Rosacea    Sleep apnea    Thoracic aorta atherosclerosis ([place])    Seen on CT scan   Vitamin D deficiency     Past Surgical History:  Procedure Laterality Date   APPENDECTOMY  1983   HERNIA REPAIR Left 2000   inguinal   INCISION AND DRAINAGE ABSCESS     KNEE ARTHROSCOPY Left    Medial meniscus   LUMBAR LAMINECTOMY/DECOMPRESSION MICRODISCECTOMY Right 05/30/2018   Procedure: Right Lumbar Three-Four Laminectomy for synovial cyst;  Surgeon: Kary Kos, MD;  Location: [place];  Service: Neurosurgery;  Laterality: Right;  posterior   SHOULDER SURGERY Bilateral    Clavicle surgeries    There were no vitals filed for this visit.    Subjective Assessment - 03/01/21 0933      Subjective Patient arrives reporting his neck pain is resolved. He has LBP on the R with sciatic symptoms. He did receive an epidural injection last week, but reports no relief yet. His pain started about 3-4 years ago, on his L after surgery to remove a cyst. His R sided back pain started about 6 months ago. He has had previous therapy for it, which helped some, but limited. It hurts worst in sitting or sleeping on his R side, uses a pillow between his knees. He feels better in standing. does not tolerate pressure on the R side.    Limitations Sitting    How long can you sit comfortably? Less than 15 minutes.    How long can you stand comfortably? N/A    How long can you walk comfortably? His wife reports that his gait has changed, he favors his R side.    Diagnostic tests MRI-small anterolisthesis of L4 over L5, evidence of previous L3-L4 laminectomy  1. Degenerative changes of the lumbar spine are progressed at the  L3-4 and L4-5 levels with severe spinal canal stenosis at L4-5 and  moderate at L3-4.    Patient Stated Goals Be able to sleep, decrease Advil use.    Currently in Pain? Yes    Pain Score 5     Pain Location Leg  Pain Orientation Right;Anterior;Proximal;Lateral    Pain Descriptors / Indicators Aching    Pain Type Chronic pain    Pain Radiating Towards into calf    Pain Onset More than a month ago    Pain Frequency Intermittent    Aggravating Factors  sitting, sleeping    Pain Relieving Factors Advil, had an injection, but it does not appear to have helped much    Effect of Pain on Daily Activities Unable to sleep or sit.                [facility] PT Assessment - 03/01/21 0001       Assessment   Medical Diagnosis R sciatic pain.    Referring Provider (PT) Georgina Snell      Balance Screen   Has the patient fallen in the past 6 months No      [place] residence    Living Arrangements Spouse/significant other    Available Help at  Discharge [facility] Layout Multi-level    Additional Comments Patient reports no issues at home, uses stairs daily.      Prior Function   Level of Independence Independent    Leisure Walking, play golf.      Posture/Postural Control   Posture Comments Forward head with pronounced T1 ledge, round shoulders. Patient reports that sitting increases pain, feel sbetter with back support.      ROM / Strength   AROM / PROM / Strength AROM;Strength      AROM   Overall AROM Comments AROM WFL, although tight in R hip.      Strength   Overall Strength Comments MMT shows strength WFL, Appears to have functional weakness, especially in RLE.      Flexibility   Soft Tissue Assessment /Muscle Length yes    Quadriceps R limited    Piriformis R limited, no tightness, mild TTP      Palpation   Palpation comment Patient reported fluctuating TTP in R piriformis, and R ant/prox/lat thigh.      Special Tests   Other special tests Patient performed Active lumbar extension in sitting, 10 x 3 sec. He reported decreased pain.      Ambulation/Gait   Gait Comments Patient wife reports that his gait has changed recently, appears to favor his RLE.                        Objective measurements completed on examination: See above findings.                PT Education - 03/01/21 1013     Education Details HEP, POC              PT Short Term Goals - 03/01/21 1727       PT SHORT TERM GOAL #1   Title I with basic HEP    Time 4    Period Weeks    Status New    Target Date 03/29/21               PT Long Term Goals - 03/01/21 1730       PT LONG TERM GOAL #1   Title I with final HEP    Time 8    Period Weeks    Status New    Target Date 04/26/21      PT LONG TERM GOAL #2   Title Patient will report no more radiating pain  into R leg.    Baseline Radiates into R lateral leg to knee.    Time 8    Period Weeks    Status New    Target Date 04/26/21       PT LONG TERM GOAL #3   Title Patient will repport sleeping through the night without taking Advil and without waking due to back pain.    Baseline Takes up to 3 Advil before bed, sleeps for up to 4 hours before waking iwth pain.    Time 8    Period Weeks    Status New    Target Date 04/26/21      PT LONG TERM GOAL #4   Title Patient will tolerate siting/driving for at least an hour without increased pain.    Baseline Cannot ride in car beyond local trips without having to stop due to pain.    Time 8    Period Weeks    Status New    Target Date 04/26/21      PT LONG TERM GOAL #5   Title Patient will demosntrate normal gait pattern with equal stance and swing B, not favoring R side.    Baseline antalgic gait on R side.    Time 8    Period Weeks    Status New    Target Date 04/26/21                    Plan - 03/01/21 1143     Clinical Impression Statement Patient presents with 3 year H/O back pain. His R sided back pain has recently increased, with sciatic signs as well. He reports that sitting increases his pain, and his pain is impeding his sleep and is worse in the am. MRI shows degenerative changes with spinal canal stenosis at L3-4, L4-5. He received an injection in his lumbar spine last week, with little relief. He feels better in sit with lumbar support. He demosntrates some muscular tightness on his R side in hip. He responded to repeated extension with decresaed pain. Patient will benefit from PT to further identify and develop exercises and positions which provide relief, allowing him to return to his previous level of activity with decreased pain.    Personal Factors and Comorbidities Past/Current Experience;Profession             Patient will benefit from skilled therapeutic intervention in order to improve the following deficits and impairments:     Visit Diagnosis: Radiculopathy, lumbar region  Muscle weakness (generalized)  Unsteadiness on  feet  Difficulty in walking, not elsewhere classified     Problem List Patient Active Problem List   Diagnosis Date Noted   Elevated blood pressure reading without diagnosis of hypertension 04/24/2020   Coronary artery calcification seen on CAT scan 02/22/2020   Synovial cyst 05/30/2018   OSA (obstructive sleep apnea) 04/06/2016   ED (erectile dysfunction) 04/20/2015   Muscle cramps 04/20/2015   Cellulitis of arm 08/06/2014   COPD, mild ([place]) 08/06/2014   Depression 08/06/2014   Hyperlipemia, mixed 08/06/2014    Marcelina Morel, DPT 03/01/2021, 5:38 PM  [place]. [place], [place], [address] Phone: [phone]   Fax:  [phone]  Name: Patrick Craig MRN: [id number] Date of Birth: 1950-12-14

## 2021-03-01 NOTE — Patient Instructions (Signed)
Access Code: [email or web address] URL: [email or web address] Date: 03/01/2021 Prepared by: Ethel Rana  Exercises Supine Figure 4 Piriformis Stretch - 1 x daily - 7 x weekly - 3 sets - 20 hold Modified Thomas Stretch - 1 x daily - 7 x weekly - 3 sets - 20 hold Thomas Stretch on Table - 1 x daily - 7 x weekly - 3 sets - 20 hold Thomas Stretch - 1 x daily - 7 x weekly - 3 sets - 20 hold Seated Lumbar Extension - 1 x daily - 7 x weekly - 10 reps - 3 hold

## 2021-03-02 ENCOUNTER — Telehealth: Payer: Self-pay | Admitting: *Deleted

## 2021-03-02 MED ORDER — TIZANIDINE HCL 4 MG PO TABS: 4.0000 mg | ORAL_TABLET | Freq: Three times a day (TID) | ORAL | 1 refills | Status: DC | PRN

## 2021-03-02 NOTE — Telephone Encounter (Signed)
Pt called stating the pharmacy did not receive the tizanidie. Resent rx to [facility] on [address].

## 2021-03-04 DIAGNOSIS — R1084 Generalized abdominal pain: Secondary | ICD-10-CM | POA: Diagnosis not present

## 2021-03-08 ENCOUNTER — Encounter: Payer: Self-pay | Admitting: Physical Therapy

## 2021-03-08 ENCOUNTER — Ambulatory Visit: Payer: PPO | Admitting: Physical Therapy

## 2021-03-08 ENCOUNTER — Other Ambulatory Visit: Payer: Self-pay

## 2021-03-08 DIAGNOSIS — R2681 Unsteadiness on feet: Secondary | ICD-10-CM

## 2021-03-08 DIAGNOSIS — M5416 Radiculopathy, lumbar region: Secondary | ICD-10-CM | POA: Diagnosis not present

## 2021-03-08 DIAGNOSIS — M6281 Muscle weakness (generalized): Secondary | ICD-10-CM

## 2021-03-08 DIAGNOSIS — R262 Difficulty in walking, not elsewhere classified: Secondary | ICD-10-CM

## 2021-03-08 NOTE — Therapy (Signed)
[place]. [place], [place], [address] Phone: [phone]   Fax:  [phone]  Physical Therapy Treatment  Patient Details  Name: Patrick Craig MRN: [id number] Date of Birth: February 02, 1950 Referring Provider (PT): Marikay Alar Date: 03/08/2021   PT End of Session - 03/08/21 1626     Visit Number 2    Number of Visits 16    Date for PT Re-Evaluation 04/26/21    PT Start Time 2774    PT Stop Time 1626    PT Time Calculation (min) 44 min    Activity Tolerance Patient tolerated treatment well    Behavior During Therapy [facility] for tasks assessed/performed             Past Medical History:  Diagnosis Date   Cellulitis of arm, left 08/06/2014   Chronic bronchitis with emphysema ([place]) 10/2019   CT chest:  Lung RADS 2.  Benign appearance.  Moderate centrilobular emphysema with diffuse bronchial thickening (chronic bronchitis).   Coronary artery calcification seen on CAT scan 10/2019   Chest CT 11/26/2019:  Three-vessel coronary atherosclerosis and aortic atherosclerosis; 03/07/2020: Coronary Calcium Score 250.   Depression    Erectile dysfunction    History of kidney stones    "30 years ago"   Hyperlipemia    Recently started on statin   Rosacea    Sleep apnea    Thoracic aorta atherosclerosis ([place])    Seen on CT scan   Vitamin D deficiency     Past Surgical History:  Procedure Laterality Date   APPENDECTOMY  1983   HERNIA REPAIR Left 2000   inguinal   INCISION AND DRAINAGE ABSCESS     KNEE ARTHROSCOPY Left    Medial meniscus   LUMBAR LAMINECTOMY/DECOMPRESSION MICRODISCECTOMY Right 05/30/2018   Procedure: Right Lumbar Three-Four Laminectomy for synovial cyst;  Surgeon: Kary Kos, MD;  Location: [place];  Service: Neurosurgery;  Laterality: Right;  posterior   SHOULDER SURGERY Bilateral    Clavicle surgeries    There were no vitals filed for this visit.   Subjective Assessment - 03/08/21 1546      Subjective Patient reports that he did some raking and his back is sore, more down the L leg than R, all the way to his foot, with numbness in outer heel.    Currently in Pain? Yes    Pain Score 7     Pain Location Leg    Pain Orientation Left;Distal;Lateral    Pain Descriptors / Indicators Aching;Burning;Nagging    Pain Type Chronic pain    Pain Radiating Towards into lateral L foot.    Pain Onset More than a month ago    Pain Frequency Intermittent    Aggravating Factors  Sitting    Pain Relieving Factors Advil    Effect of Pain on Daily Activities Unable to sleep or sit                               [facility] Adult PT Treatment/Exercise - 03/08/21 0001       Exercises   Exercises Lumbar      Lumbar Exercises: Stretches   Prone on Elbows Stretch 5 reps    Prone on Elbows Stretch Limitations 10 reps x 3 sec holds.    Other Lumbar Stretch Exercise Standing facing wall, elbows/forearms against wall. Sag hips into the wall, hold x 10 seconds, 5 reps.  Lumbar Exercises: Aerobic   Nustep L5 x 6 minutes      Lumbar Exercises: Supine   Bridge Compliant;10 reps;2 seconds    Bridge Limitations repeated x 10 with hip IR    Other Supine Lumbar Exercises Hold bridge over ball and rol ball side to side x 3, 5 reps    Other Supine Lumbar Exercises crunches with ball on tummy, knees flexed, roll ball up legs alightly.                     PT Education - 03/08/21 1619     Education Details HEP, Lumbar extension, sitting positioning in low back using a towel roll.    Person(s) Educated Patient    Methods Explanation;Demonstration;Handout    Comprehension Verbalized understanding;Returned demonstration              PT Short Term Goals - 03/01/21 1727       PT SHORT TERM GOAL #1   Title I with basic HEP    Time 4    Period Weeks    Status New    Target Date 03/29/21               PT Long Term Goals - 03/01/21 1730       PT LONG  TERM GOAL #1   Title I with final HEP    Time 8    Period Weeks    Status New    Target Date 04/26/21      PT LONG TERM GOAL #2   Title Patient will report no more radiating pain into R leg.    Baseline Radiates into R lateral leg to knee.    Time 8    Period Weeks    Status New    Target Date 04/26/21      PT LONG TERM GOAL #3   Title Patient will repport sleeping through the night without taking Advil and without waking due to back pain.    Baseline Takes up to 3 Advil before bed, sleeps for up to 4 hours before waking iwth pain.    Time 8    Period Weeks    Status New    Target Date 04/26/21      PT LONG TERM GOAL #4   Title Patient will tolerate siting/driving for at least an hour without increased pain.    Baseline Cannot ride in car beyond local trips without having to stop due to pain.    Time 8    Period Weeks    Status New    Target Date 04/26/21      PT LONG TERM GOAL #5   Title Patient will demosntrate normal gait pattern with equal stance and swing B, not favoring R side.    Baseline antalgic gait on R side.    Time 8    Period Weeks    Status New    Target Date 04/26/21                   Plan - 03/08/21 1627     Clinical Impression Statement Patient reports his pain has been improved, but he did some raking and pain increased again. Treatment focused on more lumbar extension and also trunk stabilization, assigning HEP exercises.    Personal Factors and Comorbidities Past/Current Experience;Profession    Stability/Clinical Decision Making Stable/Uncomplicated    Clinical Decision Making Low    Rehab Potential Good    PT Frequency 2x /  week    PT Duration 8 weeks    PT Treatment/Interventions ADLs/Self Care Home Management;Iontophoresis 4mg /ml Dexamethasone;Functional mobility training;Therapeutic activities;Therapeutic exercise;Neuromuscular re-education;Balance training;Manual techniques;Passive range of motion;Dry needling;Patient/family  education;Gait training    PT Next Visit Plan assess tolerance of HEP.    PT Home Exercise Plan [email or web address]    Consulted and Agree with Plan of Care Patient             Patient will benefit from skilled therapeutic intervention in order to improve the following deficits and impairments:  Decreased range of motion, Pain, Decreased mobility, Postural dysfunction, Improper body mechanics, Impaired flexibility, Decreased strength  Visit Diagnosis: Radiculopathy, lumbar region  Muscle weakness (generalized)  Unsteadiness on feet  Difficulty in walking, not elsewhere classified     Problem List Patient Active Problem List   Diagnosis Date Noted   Elevated blood pressure reading without diagnosis of hypertension 04/24/2020   Coronary artery calcification seen on CAT scan 02/22/2020   Synovial cyst 05/30/2018   OSA (obstructive sleep apnea) 04/06/2016   ED (erectile dysfunction) 04/20/2015   Muscle cramps 04/20/2015   Cellulitis of arm 08/06/2014   COPD, mild ([place]) 08/06/2014   Depression 08/06/2014   Hyperlipemia, mixed 08/06/2014    Marcelina Morel, DPT 03/08/2021, 4:42 PM  [place]. [place], [place], [address] Phone: [phone]   Fax:  [phone]  Name: Patrick Craig MRN: [id number] Date of Birth: 07-10-1950

## 2021-03-08 NOTE — Patient Instructions (Signed)
Access Code: [email or web address] URL: [email or web address] Date: 03/08/2021 Prepared by: Ethel Rana  Exercises Supine Figure 4 Piriformis Stretch - 1 x daily - 7 x weekly - 3 sets - 20 hold Modified Thomas Stretch - 1 x daily - 7 x weekly - 3 sets - 20 hold [employer] on Table - 1 x daily - 7 x weekly - 3 sets - 20 hold Thomas Stretch - 1 x daily - 7 x weekly - 3 sets - 20 hold Seated Lumbar Extension - 1 x daily - 7 x weekly - 10 reps - 3 hold Standing Lumbar Extension at [place] - 1 x daily - 7 x weekly - 3 sets - 15 hold Prone Press Up - 1 x daily - 7 x weekly - 10 reps - 3 hold Bridge with Arms at [employer] and Feet on [employer] - 1 x daily - 7 x weekly - 3 sets - 10 reps Bridge on ball and roll it side to side. - 1 x daily - 7 x weekly - 3 sets - 10 reps Swiss Ball Sit Up - 1 x daily - 7 x weekly - 3 sets - 10 reps

## 2021-03-10 ENCOUNTER — Other Ambulatory Visit: Payer: Self-pay

## 2021-03-10 ENCOUNTER — Ambulatory Visit: Payer: PPO | Admitting: Physical Therapy

## 2021-03-10 ENCOUNTER — Encounter: Payer: Self-pay | Admitting: Physical Therapy

## 2021-03-10 DIAGNOSIS — R262 Difficulty in walking, not elsewhere classified: Secondary | ICD-10-CM

## 2021-03-10 DIAGNOSIS — R2681 Unsteadiness on feet: Secondary | ICD-10-CM

## 2021-03-10 DIAGNOSIS — M5416 Radiculopathy, lumbar region: Secondary | ICD-10-CM

## 2021-03-10 NOTE — Therapy (Signed)
[place]. [place], [place], [address] Phone: [phone]   Fax:  [phone]  Physical Therapy Treatment  Patient Details  Name: Patrick Craig MRN: [id number] Date of Birth: Dec 16, 1950 Referring Provider (PT): Marikay Alar Date: 03/10/2021   PT End of Session - 03/10/21 0926     Visit Number 3    Date for PT Re-Evaluation 04/26/21    PT Start Time 0845    PT Stop Time 0930    PT Time Calculation (min) 45 min    Activity Tolerance Patient tolerated treatment well    Behavior During Therapy [facility] for tasks assessed/performed             Past Medical History:  Diagnosis Date   Cellulitis of arm, left 08/06/2014   Chronic bronchitis with emphysema ([place]) 10/2019   CT chest:  Lung RADS 2.  Benign appearance.  Moderate centrilobular emphysema with diffuse bronchial thickening (chronic bronchitis).   Coronary artery calcification seen on CAT scan 10/2019   Chest CT 11/26/2019:  Three-vessel coronary atherosclerosis and aortic atherosclerosis; 03/07/2020: Coronary Calcium Score 250.   Depression    Erectile dysfunction    History of kidney stones    "30 years ago"   Hyperlipemia    Recently started on statin   Rosacea    Sleep apnea    Thoracic aorta atherosclerosis ([place])    Seen on CT scan   Vitamin D deficiency     Past Surgical History:  Procedure Laterality Date   APPENDECTOMY  1983   HERNIA REPAIR Left 2000   inguinal   INCISION AND DRAINAGE ABSCESS     KNEE ARTHROSCOPY Left    Medial meniscus   LUMBAR LAMINECTOMY/DECOMPRESSION MICRODISCECTOMY Right 05/30/2018   Procedure: Right Lumbar Three-Four Laminectomy for synovial cyst;  Surgeon: Kary Kos, MD;  Location: [facility];  Service: Neurosurgery;  Laterality: Right;  posterior   SHOULDER SURGERY Bilateral    Clavicle surgeries    There were no vitals filed for this visit.   Subjective Assessment - 03/10/21 0848     Subjective "Im fine"  Hurt a little more last night, did walk six miles yesterday    Currently in Pain? No/denies                               [facility] Adult PT Treatment/Exercise - 03/10/21 0001       Lumbar Exercises: Stretches   Active Hamstring Stretch Left;Right;3 reps;10 seconds;20 seconds      Lumbar Exercises: Aerobic   Nustep L5 x 6 minutes      Lumbar Exercises: Machines for Strengthening   Other Lumbar Machine Exercise 25lb 2x10      Lumbar Exercises: Standing   Shoulder Extension Strengthening;Power Tower;Both;20 reps    Shoulder Extension Limitations 10    Other Standing Lumbar Exercises AR press 20lb 2x10      Lumbar Exercises: Seated   Sit to Stand 10 reps   x2 holding blue ball     Lumbar Exercises: Supine   Other Supine Lumbar Exercises LE on pball Dorothyann Peng, Oblq                       PT Short Term Goals - 03/10/21 0930       PT SHORT TERM GOAL #1   Title I with basic HEP    Status Achieved  PT Long Term Goals - 03/01/21 1730       PT LONG TERM GOAL #1   Title I with final HEP    Time 8    Period Weeks    Status New    Target Date 04/26/21      PT LONG TERM GOAL #2   Title Patient will report no more radiating pain into R leg.    Baseline Radiates into R lateral leg to knee.    Time 8    Period Weeks    Status New    Target Date 04/26/21      PT LONG TERM GOAL #3   Title Patient will repport sleeping through the night without taking Advil and without waking due to back pain.    Baseline Takes up to 3 Advil before bed, sleeps for up to 4 hours before waking iwth pain.    Time 8    Period Weeks    Status New    Target Date 04/26/21      PT LONG TERM GOAL #4   Title Patient will tolerate siting/driving for at least an hour without increased pain.    Baseline Cannot ride in car beyond local trips without having to stop due to pain.    Time 8    Period Weeks    Status New    Target Date 04/26/21       PT LONG TERM GOAL #5   Title Patient will demosntrate normal gait pattern with equal stance and swing B, not favoring R side.    Baseline antalgic gait on R side.    Time 8    Period Weeks    Status New    Target Date 04/26/21                   Plan - 03/10/21 3235     Clinical Impression Statement Pt enters clinic feeling fine and able to tolerated a progression with core strength. No pain reported throughout session. Tactile cue to prevent trunk flexion with shoulder extension.Cue for anterior weight shift needed with sit to stands. Cue for core stability needed with supine pball interventions.    Personal Factors and Comorbidities Past/Current Experience;Profession    Stability/Clinical Decision Making Stable/Uncomplicated    Rehab Potential Good    PT Frequency 2x / week    PT Duration 8 weeks    PT Treatment/Interventions ADLs/Self Care Home Management;Iontophoresis 4mg /ml Dexamethasone;Functional mobility training;Therapeutic activities;Therapeutic exercise;Neuromuscular re-education;Balance training;Manual techniques;Passive range of motion;Dry needling;Patient/family education;Gait training    PT Next Visit Plan postural core strength             Patient will benefit from skilled therapeutic intervention in order to improve the following deficits and impairments:  Decreased range of motion, Pain, Decreased mobility, Postural dysfunction, Improper body mechanics, Impaired flexibility, Decreased strength  Visit Diagnosis: Radiculopathy, lumbar region  Unsteadiness on feet  Difficulty in walking, not elsewhere classified     Problem List Patient Active Problem List   Diagnosis Date Noted   Elevated blood pressure reading without diagnosis of hypertension 04/24/2020   Coronary artery calcification seen on CAT scan 02/22/2020   Synovial cyst 05/30/2018   OSA (obstructive sleep apnea) 04/06/2016   ED (erectile dysfunction) 04/20/2015   Muscle cramps  04/20/2015   Cellulitis of arm 08/06/2014   COPD, mild ([place]) 08/06/2014   Depression 08/06/2014   Hyperlipemia, mixed 08/06/2014    Scot Jun, PTA 03/10/2021, 9:30 AM  [facility]  [address]. [place], [place], [address] Phone: [phone]   Fax:  [phone]  Name: Patrick Craig MRN: [id number] Date of Birth: [birth year]

## 2021-03-14 ENCOUNTER — Telehealth: Payer: Self-pay | Admitting: Family Medicine

## 2021-03-14 DIAGNOSIS — G8929 Other chronic pain: Secondary | ICD-10-CM

## 2021-03-14 NOTE — Telephone Encounter (Signed)
Patient called stating that he had an epidural done on January 26th but has not had any improvement. He asked if he should have another epidural or what the next steps would be?  Please advise.

## 2021-03-15 NOTE — Telephone Encounter (Signed)
The plan at this point should be to repeat the MRI.  Your last MRI was a year ago and before we do another epidural steroid injection we really should understand what is going on your back better. New MRI ordered.

## 2021-03-15 NOTE — Telephone Encounter (Signed)
Called pt and relayed Dr. Clovis Riley message.  Advised pt that a new MRI order has been placed to [facility].  Pt verbalizes understanding.

## 2021-03-16 ENCOUNTER — Other Ambulatory Visit: Payer: Self-pay

## 2021-03-16 ENCOUNTER — Encounter: Payer: Self-pay | Admitting: Physical Therapy

## 2021-03-16 ENCOUNTER — Ambulatory Visit: Payer: PPO | Admitting: Physical Therapy

## 2021-03-16 DIAGNOSIS — M6281 Muscle weakness (generalized): Secondary | ICD-10-CM

## 2021-03-16 DIAGNOSIS — M5416 Radiculopathy, lumbar region: Secondary | ICD-10-CM

## 2021-03-16 DIAGNOSIS — R262 Difficulty in walking, not elsewhere classified: Secondary | ICD-10-CM

## 2021-03-16 DIAGNOSIS — R2681 Unsteadiness on feet: Secondary | ICD-10-CM

## 2021-03-16 NOTE — Therapy (Signed)
[place]. [place], [place], [address] Phone: [phone]   Fax:  [phone]  Physical Therapy Treatment  Patient Details  Name: Patrick Craig MRN: [id number] Date of Birth: November 03, 1950 Referring Provider (PT): Marikay Alar Date: 03/16/2021   PT End of Session - 03/16/21 1010     Visit Number 4    Date for PT Re-Evaluation 04/26/21    PT Start Time 0938    PT Stop Time 4259    PT Time Calculation (min) 37 min    Activity Tolerance Patient tolerated treatment well    Behavior During Therapy [facility] for tasks assessed/performed             Past Medical History:  Diagnosis Date   Cellulitis of arm, left 08/06/2014   Chronic bronchitis with emphysema ([place]) 10/2019   CT chest:  Lung RADS 2.  Benign appearance.  Moderate centrilobular emphysema with diffuse bronchial thickening (chronic bronchitis).   Coronary artery calcification seen on CAT scan 10/2019   Chest CT 11/26/2019:  Three-vessel coronary atherosclerosis and aortic atherosclerosis; 03/07/2020: Coronary Calcium Score 250.   Depression    Erectile dysfunction    History of kidney stones    "30 years ago"   Hyperlipemia    Recently started on statin   Rosacea    Sleep apnea    Thoracic aorta atherosclerosis ([place])    Seen on CT scan   Vitamin D deficiency     Past Surgical History:  Procedure Laterality Date   APPENDECTOMY  1983   HERNIA REPAIR Left 2000   inguinal   INCISION AND DRAINAGE ABSCESS     KNEE ARTHROSCOPY Left    Medial meniscus   LUMBAR LAMINECTOMY/DECOMPRESSION MICRODISCECTOMY Right 05/30/2018   Procedure: Right Lumbar Three-Four Laminectomy for synovial cyst;  Surgeon: Kary Kos, MD;  Location: [facility];  Service: Neurosurgery;  Laterality: Right;  posterior   SHOULDER SURGERY Bilateral    Clavicle surgeries    There were no vitals filed for this visit.   Subjective Assessment - 03/16/21 0939     Subjective Not  really good, hurting down his legs. Did his stretches Monday or Tuesday but he rolled all night long    Currently in Pain? No/denies                               [facility] Adult PT Treatment/Exercise - 03/16/21 0001       Lumbar Exercises: Stretches   Single Knee to Chest Stretch Left;Right;3 reps;10 seconds    Double Knee to Chest Stretch 3 reps;10 seconds    Figure 4 Stretch 4 reps;10 seconds;20 seconds;With overpressure      Lumbar Exercises: Aerobic   Recumbent Bike L4 x 6 min      Lumbar Exercises: Machines for Strengthening   Leg Press 50lb 2x10    Other Lumbar Machine Exercise Rows & Lats 35lb 2x10      Lumbar Exercises: Standing   Shoulder Extension Strengthening;Power Tower;Both;20 reps    Shoulder Extension Limitations 10      Lumbar Exercises: Supine   Other Supine Lumbar Exercises LE on pball Dorothyann Peng, Oblq                       PT Short Term Goals - 03/10/21 0930       PT SHORT TERM GOAL #1   Title I with  basic HEP    Status Achieved               PT Long Term Goals - 03/01/21 1730       PT LONG TERM GOAL #1   Title I with final HEP    Time 8    Period Weeks    Status New    Target Date 04/26/21      PT LONG TERM GOAL #2   Title Patient will report no more radiating pain into R leg.    Baseline Radiates into R lateral leg to knee.    Time 8    Period Weeks    Status New    Target Date 04/26/21      PT LONG TERM GOAL #3   Title Patient will repport sleeping through the night without taking Advil and without waking due to back pain.    Baseline Takes up to 3 Advil before bed, sleeps for up to 4 hours before waking iwth pain.    Time 8    Period Weeks    Status New    Target Date 04/26/21      PT LONG TERM GOAL #4   Title Patient will tolerate siting/driving for at least an hour without increased pain.    Baseline Cannot ride in car beyond local trips without having to stop due to pain.    Time 8     Period Weeks    Status New    Target Date 04/26/21      PT LONG TERM GOAL #5   Title Patient will demosntrate normal gait pattern with equal stance and swing B, not favoring R side.    Baseline antalgic gait on R side.    Time 8    Period Weeks    Status New    Target Date 04/26/21                   Plan - 03/16/21 1018     Clinical Impression Statement Pt ~ 8 min late for today session reporting increase pain. He stated pain started earlier this week making it difficult to sleep. Report no issue standing up on laying down and sitting. He is currently waiting to schedule A MRI. Session focused on postural core strengths and lumbar flexibility.Cue to prevent trunk flexion needed with standing shoulder extensions. Tactile cue to engage core during supine interventions. Reports LB tightness with stretching.    Personal Factors and Comorbidities Past/Current Experience;Profession    Stability/Clinical Decision Making Stable/Uncomplicated    PT Frequency 2x / week    PT Duration 8 weeks    PT Treatment/Interventions ADLs/Self Care Home Management;Iontophoresis 4mg /ml Dexamethasone;Functional mobility training;Therapeutic activities;Therapeutic exercise;Neuromuscular re-education;Balance training;Manual techniques;Passive range of motion;Dry needling;Patient/family education;Gait training    PT Next Visit Plan postural core strength             Patient will benefit from skilled therapeutic intervention in order to improve the following deficits and impairments:  Decreased range of motion, Pain, Decreased mobility, Postural dysfunction, Improper body mechanics, Impaired flexibility, Decreased strength  Visit Diagnosis: Radiculopathy, lumbar region  Muscle weakness (generalized)  Difficulty in walking, not elsewhere classified  Unsteadiness on feet     Problem List Patient Active Problem List   Diagnosis Date Noted   Elevated blood pressure reading without diagnosis of  hypertension 04/24/2020   Coronary artery calcification seen on CAT scan 02/22/2020   Synovial cyst 05/30/2018   OSA (obstructive sleep apnea) 04/06/2016  ED (erectile dysfunction) 04/20/2015   Muscle cramps 04/20/2015   Cellulitis of arm 08/06/2014   COPD, mild ([place]) 08/06/2014   Depression 08/06/2014   Hyperlipemia, mixed 08/06/2014    Scot Jun, PTA 03/16/2021, 10:22 AM  [place]. [place], [place], [address] Phone: [phone]   Fax:  [phone]  Name: Patrick Craig MRN: [id number] Date of Birth: [birth year]

## 2021-03-19 ENCOUNTER — Ambulatory Visit (INDEPENDENT_AMBULATORY_CARE_PROVIDER_SITE_OTHER): Payer: PPO

## 2021-03-19 ENCOUNTER — Other Ambulatory Visit: Payer: Self-pay

## 2021-03-19 DIAGNOSIS — M545 Low back pain, unspecified: Secondary | ICD-10-CM | POA: Diagnosis not present

## 2021-03-19 DIAGNOSIS — M5441 Lumbago with sciatica, right side: Secondary | ICD-10-CM | POA: Diagnosis not present

## 2021-03-19 DIAGNOSIS — M4316 Spondylolisthesis, lumbar region: Secondary | ICD-10-CM | POA: Diagnosis not present

## 2021-03-19 DIAGNOSIS — M48061 Spinal stenosis, lumbar region without neurogenic claudication: Secondary | ICD-10-CM | POA: Diagnosis not present

## 2021-03-19 DIAGNOSIS — G8929 Other chronic pain: Secondary | ICD-10-CM

## 2021-03-21 NOTE — Progress Notes (Signed)
MRI lumbar spine does show that your overall nerve impingement has improved a bit from last year's MRI however there is the possibility of the right L3 nerve getting pinched.  I could reorder an epidural steroid injection if you would like.  Otherwise we can talk about this in full detail during her follow-up visit on March 3.

## 2021-03-22 ENCOUNTER — Telehealth: Payer: Self-pay | Admitting: Family Medicine

## 2021-03-22 NOTE — Telephone Encounter (Signed)
Patient called regarding his MRI.  He would like for Dr Georgina Snell to go ahead and order another epidural.

## 2021-03-23 NOTE — Progress Notes (Signed)
I, Patrick Craig, LAT, ATC, am serving as scribe for Dr. Lynne Craig.  Patrick Craig is a 71 y.o. male who presents to [place] at [facility] today for f/u of chronic LBP w/ radiating pain into the R post thigh and to review his L-spine MRI.  He was last seen by Dr. Georgina Craig on 02/17/21 and was referred to PT and for an ESI that he had on 02/23/21 (R L4-5).  He has completed 4 PT sessions for his neck pain.  Due to not having much if any improvement after his ESI, pt was referred for a f/u L-spine MRI. Today, pt reports LBP has worsened and was hardly able to walk yesterday. Pt continues to have radiating pain into his R buttock and leg.  His main pain is mostly in his right lateral hip now.  He has less pain radiating down his right leg.  Pain occurs at right lateral hip when standing from a seated position and with first few steps of gait.  He also notes pain in right lateral hip when he lays on his right side at night.  Diagnostic testing: L-spine MRI- 03/19/21, 02/25/20, 01/06/18; L-spine XR- 02/17/21, 08/22/19, 05/30/18  Pertinent review of systems: No fevers or chills  Relevant historical information: Sleep apnea and COPD   Exam:  BP 136/84    Pulse 63    Ht 6\' 1"  (1.854 m)    Wt 223 lb 6.4 oz (101.3 kg)    SpO2 98%    BMI 29.47 kg/m  General: Well Developed, well nourished, and in no acute distress.   MSK:  Right hip normal. Tender palpation greater trochanter. Normal hip motion. Hip abduction strength is diminished 4/5 with pain.  External rotation strength is diminished 4/5.    Lab and Radiology Results  Hip greater trochanteric injection: Right Consent obtained and timeout performed. Area of maximum tenderness palpated and identified. Skin cleaned with alcohol, cold spray applied. A 22g needle was used to access the greater trochanteric bursa. 40mg  of Kenalog and 2 mL of Marcaine were used to inject the trochanteric bursa. Patient tolerated the procedure  well.    EXAM: MRI LUMBAR SPINE WITHOUT CONTRAST   TECHNIQUE: Multiplanar, multisequence MR imaging of the lumbar spine was performed. No intravenous contrast was administered.   COMPARISON:  Lumbar spine MRI 02/25/2020   FINDINGS: Segmentation: Standard; the lowest formed disc space is designated L5-S1.   Alignment: There is trace retrolisthesis of L1 on L2 and grade 1 anterolisthesis of L4 on L5, unchanged. Alignment is otherwise normal.   Vertebrae: Vertebral body heights are preserved. There is no marrow edema. There is no suspicious marrow signal abnormality. Postsurgical changes reflecting right laminectomy at L3-L4 are again seen.   Conus medullaris and cauda equina: Conus extends to the L1-L2 level. Conus and cauda equina appear normal.   Paraspinal and other soft tissues: Unremarkable.   Disc levels:   There is multilevel disc desiccation with mild loss of height at L1-L2 through L3-L4. There is advanced facet arthropathy in the lower lumbar spine, most severe at L3-L4 and L4-L5. There are associated bilateral effusions at both levels, left larger than right.   T12-L1: No significant spinal canal or neural foraminal stenosis.   L1-L2: There is a mild disc bulge and mild bilateral facet arthropathy resulting in mild right and no significant left neural foraminal stenosis and mild narrowing of the right subarticular zone without evidence of nerve root impingement, overall not significantly changed.  L2-L3: There is a mild disc bulge, degenerative endplate change, and bilateral facet arthropathy resulting in mild spinal canal stenosis with narrowing of the left subarticular zone but no evidence of frank nerve root impingement, and mild left worse than right neural foraminal stenosis. Findings are overall not significantly changed.   L3-L4: There is a diffuse disc bulge with a left foraminal component, and bulky bilateral facet arthropathy resulting  in mild-to-moderate spinal canal stenosis with effacement of the subarticular zones, left worse than right, and possible impingement of the traversing left L4 nerve root, and mild right and moderate left neural foraminal stenosis with possible contact of the exiting left L3 nerve root. Compared to the study from 2022, the spinal canal and left neural foraminal stenosis have improved.   L4-L5: There is grade 1 anterolisthesis with uncovering of the disc posteriorly and a mild superimposed bulge, and bulky left worse than right facet arthropathy resulting in mild-to-moderate spinal canal stenosis with effacement of the left subarticular zone and possible impingement of the traversing L5 nerve root, and mild-to-moderate left and mild right neural foraminal stenosis. Compared to the prior study, the spinal canal stenosis has improved. A previously seen synovial cyst on the right is no longer present.   L5-S1: There is degenerative endplate change and mild bilateral facet arthropathy without significant spinal canal or neural foraminal stenosis.   IMPRESSION: 1. 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 There is moderate left neural foraminal stenosis at L3-L4 which also appears improved compared to the prior study. 2. Bulky facet arthropathy at L3-L4 and L4-L5 with left larger than right effusions at both levels, similar to the prior study. 3. Otherwise, multilevel degenerative changes throughout the remainder of the lumbar spine as detailed above without other high-grade spinal canal or neural foraminal stenosis.     Electronically Signed   By: Patrick Craig M.D.   On: 03/20/2021 09:03 I, Patrick Craig, personally (independently) visualized and performed the interpretation of the images  attached in this note.    Assessment and Plan: 71 y.o. male with right leg pain.  Multifactorial. I do believe some of his pain is lumbar radiculopathy probably an L5 dermatomal pattern.  However first epidural steroid injection at right L4-5 did not help.  His majority of his pain now has shifted to the more lateral hip and thought to be greater trochanteric bursitis/hip abductor tendinopathy.  Plan to treat with greater troches injection and shifting PT to focus on his hip abductors.  If not improving he will let me know and I would recommend a second epidural steroid injection if that still does not work would recommend x-ray and hip MRI.   PDMP not reviewed this encounter. Orders Placed This Encounter  Procedures   Ambulatory referral to Physical Therapy    Referral Priority:   Routine    Referral Type:   Physical Medicine    Referral Reason:   Specialty Services Required    Requested Specialty:   Physical Therapy    Number of Visits Requested:   1   No orders of the defined types were placed in this encounter.    Discussed warning signs or symptoms. Please see discharge instructions. Patient expresses understanding.   The above documentation has been reviewed and is accurate and complete Patrick Craig, M.D.

## 2021-03-24 ENCOUNTER — Ambulatory Visit (INDEPENDENT_AMBULATORY_CARE_PROVIDER_SITE_OTHER): Payer: PPO | Admitting: Family Medicine

## 2021-03-24 ENCOUNTER — Other Ambulatory Visit: Payer: Self-pay

## 2021-03-24 VITALS — BP 136/84 | HR 63 | Ht 73.0 in | Wt 223.4 lb

## 2021-03-24 DIAGNOSIS — G8929 Other chronic pain: Secondary | ICD-10-CM

## 2021-03-24 DIAGNOSIS — M5441 Lumbago with sciatica, right side: Secondary | ICD-10-CM

## 2021-03-24 DIAGNOSIS — M25551 Pain in right hip: Secondary | ICD-10-CM | POA: Diagnosis not present

## 2021-03-24 NOTE — Patient Instructions (Addendum)
Thank you for coming in today.   You received a steroid injection in your right hip today. Seek immediate medical attention if the joint becomes red, extremely painful, or is oozing fluid.   I've added onto your physical therapy for them to work on your hip abductors  Recheck back as needed

## 2021-03-30 DIAGNOSIS — L814 Other melanin hyperpigmentation: Secondary | ICD-10-CM | POA: Diagnosis not present

## 2021-03-30 DIAGNOSIS — L738 Other specified follicular disorders: Secondary | ICD-10-CM | POA: Diagnosis not present

## 2021-03-30 DIAGNOSIS — L821 Other seborrheic keratosis: Secondary | ICD-10-CM | POA: Diagnosis not present

## 2021-03-30 DIAGNOSIS — M71342 Other bursal cyst, left hand: Secondary | ICD-10-CM | POA: Diagnosis not present

## 2021-03-30 DIAGNOSIS — D225 Melanocytic nevi of trunk: Secondary | ICD-10-CM | POA: Diagnosis not present

## 2021-03-31 ENCOUNTER — Encounter: Payer: Self-pay | Admitting: Physical Therapy

## 2021-03-31 ENCOUNTER — Other Ambulatory Visit: Payer: Self-pay

## 2021-03-31 ENCOUNTER — Ambulatory Visit: Payer: PPO | Admitting: Family Medicine

## 2021-03-31 ENCOUNTER — Ambulatory Visit: Payer: PPO | Attending: Family Medicine | Admitting: Physical Therapy

## 2021-03-31 DIAGNOSIS — R262 Difficulty in walking, not elsewhere classified: Secondary | ICD-10-CM | POA: Insufficient documentation

## 2021-03-31 DIAGNOSIS — M5416 Radiculopathy, lumbar region: Secondary | ICD-10-CM | POA: Insufficient documentation

## 2021-03-31 DIAGNOSIS — M6281 Muscle weakness (generalized): Secondary | ICD-10-CM | POA: Diagnosis not present

## 2021-03-31 DIAGNOSIS — M25551 Pain in right hip: Secondary | ICD-10-CM | POA: Insufficient documentation

## 2021-03-31 DIAGNOSIS — G8929 Other chronic pain: Secondary | ICD-10-CM | POA: Diagnosis not present

## 2021-03-31 NOTE — Therapy (Signed)
[place] ?[place] [address] ?[place], [place], [address] ?Phone: [phone]   Fax:  [phone] ? ?Physical Therapy Treatment ?PHYSICAL THERAPY DISCHARGE SUMMARY ? ?Visits from Start of Care: 5 ? ?Current functional level related to goals / functional outcomes: ?Goals met ?  ?Remaining deficits: ?N/A ?  ?Education / Equipment: ?HEP  ? ?Patient agrees to discharge. Patient goals were met. Patient is being discharged due to meeting the stated rehab goals.  ?Patient Details  ?Name: Patrick Craig ?MRN: [id number] ?Date of Birth: Mar 27, 1950 ?Referring Provider (PT): Georgina Snell ? ? ?Encounter Date: 03/31/2021 ? ? PT End of Session - 03/31/21 1040   ? ? Visit Number 5   ? Number of Visits 16   ? Date for PT Re-Evaluation 04/26/21   ? PT Start Time 1015   ? PT Stop Time 2725   ? PT Time Calculation (min) 25 min   ? Activity Tolerance Patient tolerated treatment well   ? Behavior During Therapy [facility] for tasks assessed/performed   ? ?  ?  ? ?  ? ? ?Past Medical History:  ?Diagnosis Date  ? Cellulitis of arm, left 08/06/2014  ? Chronic bronchitis with emphysema ([place]) 10/2019  ? CT chest:  Lung RADS 2.  Benign appearance.  Moderate centrilobular emphysema with diffuse bronchial thickening (chronic bronchitis).  ? Coronary artery calcification seen on CAT scan 10/2019  ? Chest CT 11/26/2019:  Three-vessel coronary atherosclerosis and aortic atherosclerosis; 03/07/2020: Coronary Calcium Score 250.  ? Depression   ? Erectile dysfunction   ? History of kidney stones   ? "30 years ago"  ? Hyperlipemia   ? Recently started on statin  ? Rosacea   ? Sleep apnea   ? Thoracic aorta atherosclerosis ([place])   ? Seen on CT scan  ? Vitamin D deficiency   ? ? ?Past Surgical History:  ?Procedure Laterality Date  ? APPENDECTOMY  1983  ? HERNIA REPAIR Left 2000  ? inguinal  ? INCISION AND DRAINAGE ABSCESS    ? KNEE ARTHROSCOPY Left   ? Medial meniscus  ? LUMBAR LAMINECTOMY/DECOMPRESSION MICRODISCECTOMY  Right 05/30/2018  ? Procedure: Right Lumbar Three-Four Laminectomy for synovial cyst;  Surgeon: Kary Kos, MD;  Location: [facility];  Service: Neurosurgery;  Laterality: Right;  posterior  ? SHOULDER SURGERY Bilateral   ? Clavicle surgeries  ? ? ?There were no vitals filed for this visit. ? ? Subjective Assessment - 03/31/21 1014   ? ? Subjective Feeling a little better. Got a Cortizone shot  his R hip and it has helped. Has been focusing on posture.   ? Currently in Pain? No/denies   ? ?  ?  ? ?  ? ? ? ? ? ? ? ? ? ? ? ? ? ? ? ? ? ? ? ? [facility] Adult PT Treatment/Exercise - 03/31/21 0001   ? ?  ? Exercises  ? Exercises Knee/Hip   ?  ? Lumbar Exercises: Aerobic  ? Recumbent Bike L4 x 8 min   ?  ? Lumbar Exercises: Seated  ? Sit to Stand 15 reps   ?  ? Knee/Hip Exercises: Standing  ? Hip Flexion Stengthening;Both;1 set;10 reps;Knee straight   ? Hip ADduction Both;1 set;10 reps   ? Hip Extension Stengthening;Both;1 set;10 reps;Knee straight   ? ?  ?  ? ?  ? ? ? ? ? ? ? ? ? ? ? ? PT Short Term Goals - 03/10/21 0930   ? ?  ?  PT SHORT TERM GOAL #1  ? Title I with basic HEP   ? Status Achieved   ? ?  ?  ? ?  ? ? ? ? PT Long Term Goals - 03/31/21 1040   ? ?  ? PT LONG TERM GOAL #1  ? Title I with final HEP   ? Status Achieved   ?  ? PT LONG TERM GOAL #2  ? Title Patient will report no more radiating pain into R leg.   ? Status Achieved   ?  ? PT LONG TERM GOAL #3  ? Title Patient will repport sleeping through the night without taking Advil and without waking due to back pain.   ? Status Achieved   ?  ? PT LONG TERM GOAL #4  ? Title Patient will tolerate siting/driving for at least an hour without increased pain.   ? Status Achieved   ?  ? PT LONG TERM GOAL #5  ? Title Patient will demosntrate normal gait pattern with equal stance and swing B, not favoring R side.   ? Status Achieved   ? ?  ?  ? ?  ? ? ? ? ? ? ? ? Plan - 03/31/21 1040   ? ? Clinical Impression Statement Pt enters clinic reporting a recent injection in is R hip and  feeling fine. He reports interest in living a more active life style and would like some additional hip strengthening exercises to do at home. He reports no functional limitations and is pleased with his current functional status. Pt has progressed meeting LTG's. Went over all HEP interventions, Pt returning with demonstration and verbalized understanding.   ? Personal Factors and Comorbidities Past/Current Experience;Profession   ? Stability/Clinical Decision Making Stable/Uncomplicated   ? Rehab Potential Good   ? PT Frequency 2x / week   ? PT Duration 8 weeks   ? PT Treatment/Interventions ADLs/Self Care Home Management;Iontophoresis 68m/ml Dexamethasone;Functional mobility training;Therapeutic activities;Therapeutic exercise;Neuromuscular re-education;Balance training;Manual techniques;Passive range of motion;Dry needling;Patient/family education;Gait training   ? PT Next Visit Plan D/C PT   ? PT Home Exercise Plan Access Code: [email or web address]  URL: [email or web address]  Date: 03/31/2021  Prepared by: RCheri Fowler   Exercises  Supine Bridge - 1 x daily - 7 x weekly - 3 sets - 10 reps  Single Leg Bridge - 1 x daily - 7 x weekly - 3 sets - 10 reps  Sit to Stand - 1 x daily - 7 x weekly - 3 sets - 10 reps  Standing Hip Abduction with Resistance at Ankles and Counter Support - 1 x daily - 7 x weekly - 3 sets - 10 reps  Standing 3-Way Leg Reach with Resistance at Ankles and Counter Support - 1 x daily - 7 x weekly - 3 sets - 10 reps  Standing Hip Flexion with Resistance Loop - 1 x daily - 7 x weekly - 3 sets - 10 reps   ? ?  ?  ? ?  ? ? ?Patient will benefit from skilled therapeutic intervention in order to improve the following deficits and impairments:  Decreased range of motion, Pain, Decreased mobility, Postural dysfunction, Improper body mechanics, Impaired flexibility, Decreased strength ? ?Visit Diagnosis: ?Radiculopathy, lumbar region ? ?Muscle weakness (generalized) ? ?Difficulty in walking, not  elsewhere classified ? ? ? ? ?Problem List ?Patient Active Problem List  ? Diagnosis Date Noted  ? Elevated blood pressure reading without diagnosis of hypertension 04/24/2020  ? Coronary artery calcification seen on  CAT scan 02/22/2020  ? Synovial cyst 05/30/2018  ? OSA (obstructive sleep apnea) 04/06/2016  ? ED (erectile dysfunction) 04/20/2015  ? Muscle cramps 04/20/2015  ? Cellulitis of arm 08/06/2014  ? COPD, mild ([place]) 08/06/2014  ? Depression 08/06/2014  ? Hyperlipemia, mixed 08/06/2014  ? ?PHYSICAL THERAPY DISCHARGE SUMMARY ? ?Visits from Start of Care: 5 ? ?Patient agrees to discharge. Patient goals were met. Patient is being discharged due to being pleased with the current functional level. ? ?Ethel Rana DPT ?03/31/21 11:51 AM  ? ?Scot Jun, PTA ?03/31/2021, 10:45 AM ? ?[place] ?[place] ?[address] ?[place], [place], [address] ?Phone: [phone]   Fax:  [phone] ? ?Name: Patrick Craig ?MRN: [id number] ?Date of Birth: 04-14-1950 ? ? ? ?

## 2021-04-13 ENCOUNTER — Telehealth: Payer: Self-pay | Admitting: Physical Therapy

## 2021-04-13 NOTE — Progress Notes (Signed)
? ?I, [name], LAT, ATC, am serving as scribe for Dr. Lynne Leader. ? ?Patrick Craig is a 71 y.o. male who presents to [place] at [facility] today for L hip pain likely associated w/ previously diagnosed lumbar radiculopathy.  He was last seen on 03/24/21 for f/u of  chronic LBP w/ radiating pain into the R post thigh and to review his L-spine MRI.  He had a R GT steroid injection and had his PT order modified to add his hip.  He had a prior R L4-5  ESI on 02/23/21 w/ no relief noted.  Today, pt reports L hip has been feeling pretty good from R GT steroid injection. Pt c/o L-sided buttock pain w/ radiating symptoms distally to the L lower leg. Pt notes tingling present. ? ?Diagnostic testing: L-spine MRI- 03/19/21, 02/25/20, 01/06/18; L-spine XR- 02/17/21, 08/22/19, 05/30/18 ? ?Pertinent review of systems: No fevers or chills.  He has been having some nighttime cramps in his bilateral lower extremities. ? ?Relevant historical information: COPD ? ? ?Exam:  ?BP (!) 162/100   Pulse (!) 59   Ht '6\' 1"'$  (1.854 m)   Wt 220 lb (99.8 kg)   SpO2 96%   BMI 29.03 kg/m?  ?General: Well Developed, well nourished, and in no acute distress.  ? ?MSK: L-spine: Nontender midline. ?Lower extremity strength is intact. ?Reflexes are intact. ? ?Left hip: Normal-appearing ?Normal motion. ?Nontender at greater trochanter. ?Hip abduction strength 4+/5 without significant pain. ? ?Lab and Radiology Results ? ? ?EXAM: ?MRI LUMBAR SPINE WITHOUT CONTRAST ?  ?TECHNIQUE: ?Multiplanar, multisequence MR imaging of the lumbar spine was ?performed. No intravenous contrast was administered. ?  ?COMPARISON:  Lumbar spine MRI 02/25/2020 ?  ?FINDINGS: ?Segmentation: Standard; the lowest formed disc space is designated ?L5-S1. ?  ?Alignment: There is trace retrolisthesis of L1 on L2 and grade 1 ?anterolisthesis of L4 on L5, unchanged. Alignment is otherwise ?normal. ?  ?Vertebrae: Vertebral body heights are preserved. There is  no marrow ?edema. There is no suspicious marrow signal abnormality. ?Postsurgical changes reflecting right laminectomy at L3-L4 are again ?seen. ?  ?Conus medullaris and cauda equina: Conus extends to the L1-L2 level. ?Conus and cauda equina appear normal. ?  ?Paraspinal and other soft tissues: Unremarkable. ?  ?Disc levels: ?  ?There is multilevel disc desiccation with mild loss of height at ?L1-L2 through L3-L4. There is advanced facet arthropathy in the ?lower lumbar spine, most severe at L3-L4 and L4-L5. There are ?associated bilateral effusions at both levels, left larger than ?right. ?  ?T12-L1: No significant spinal canal or neural foraminal stenosis. ?  ?L1-L2: There is a mild disc bulge and mild bilateral facet ?arthropathy resulting in mild right and no significant left neural ?foraminal stenosis and mild narrowing of the right subarticular zone ?without evidence of nerve root impingement, overall not ?significantly changed. ?  ?L2-L3: There is a mild disc bulge, degenerative endplate change, and ?bilateral facet arthropathy resulting in mild spinal canal stenosis ?with narrowing of the left subarticular zone but no evidence of ?frank nerve root impingement, and mild left worse than right neural ?foraminal stenosis. Findings are overall not significantly changed. ?  ?L3-L4: There is a diffuse disc bulge with a left foraminal ?component, and bulky bilateral facet arthropathy resulting in ?mild-to-moderate spinal canal stenosis with effacement of the ?subarticular zones, left worse than right, and possible impingement ?of the traversing left L4 nerve root, and mild right and moderate ?left neural foraminal stenosis with possible contact  of the exiting ?left L3 nerve root. Compared to the study from 2022, the spinal ?canal and left neural foraminal stenosis have improved. ?  ?L4-L5: There is grade 1 anterolisthesis with uncovering of the disc ?posteriorly and a mild superimposed bulge, and bulky left worse  than ?right facet arthropathy resulting in mild-to-moderate spinal canal ?stenosis with effacement of the left subarticular zone and possible ?impingement of the traversing L5 nerve root, and mild-to-moderate ?left and mild right neural foraminal stenosis. Compared to the prior ?study, the spinal canal stenosis has improved. A previously seen ?synovial cyst on the right is no longer present. ?  ?L5-S1: There is degenerative endplate change and mild bilateral ?facet arthropathy without significant spinal canal or neural ?foraminal stenosis. ?  ?IMPRESSION: ?1. Compared to the study from 02/25/2020, spinal canal stenosis at ?L3-L4 and L4-L5 has improved, though there remains effacement of the ?left subarticular zone at both levels with possible impingement of ?the traversing L4 and L5 nerve roots. There is moderate left neural ?foraminal stenosis at L3-L4 which also appears improved compared to ?the prior study. ?2. Bulky facet arthropathy at L3-L4 and L4-L5 with left larger than ?right effusions at both levels, similar to the prior study. ?3. Otherwise, multilevel degenerative changes throughout the ?remainder of the lumbar spine as detailed above without other ?high-grade spinal canal or neural foraminal stenosis. ?  ?  ?Electronically Signed ?  By: Valetta Mole M.D. ?  On: 03/20/2021 09:03 ?  ?I, Lynne Leader, personally (independently) visualized and performed the interpretation of the images attached in this note. ? ? ? ? ?Assessment and Plan: ?71 y.o. male with left lumbar radiculopathy at L5.  He does have this possibility on his MRI from February 2022.  Plan for epidural steroid injection focused on this level.  Also will prescribe gabapentin.  Watchful waiting on the cramping.  I suspect it will probably improve with gabapentin and the epidural steroid injection.  If not can proceed with a more metabolic work-up and trial of baclofen at bedtime. ? ? ?PDMP not reviewed this encounter. ?Orders Placed This  Encounter  ?Procedures  ? DG INJECT DIAG/THERA/INC NEEDLE/CATH/PLC EPI/LUMB/SAC W/IMG  ?  Level and technique per radiology ? ?To address L-side L5 radicular symptoms  ?  Standing Status:   Future  ?  Standing Expiration Date:   05/15/2021  ?  Order Specific Question:   Reason for Exam (SYMPTOM  OR DIAGNOSIS REQUIRED)  ?  Answer:   lumbar radiculopathy  ?  Order Specific Question:   Preferred Imaging Location?  ?  Answer:   [id number]  ? ?Meds ordered this encounter  ?Medications  ? gabapentin (NEURONTIN) 300 MG capsule  ?  Sig: Take 1 capsule (300 mg total) by mouth 3 (three) times daily.  ?  Dispense:  90 capsule  ?  Refill:  2  ? ? ? ?Discussed warning signs or symptoms. Please see discharge instructions. Patient expresses understanding. ? ? ?The above documentation has been reviewed and is accurate and complete Lynne Leader, M.D. ? ? ?

## 2021-04-13 NOTE — Telephone Encounter (Signed)
Called pt and LM advising him that he does not need to come in for a f/u visit just to have a 2nd epidural ordered.  Told him that we can place that order w/o him being seen and advised him to return call to let us know if he wants to keep or cancel appt for tomorrow. ?

## 2021-04-13 NOTE — Telephone Encounter (Signed)
Pt wants 2nd epidural for left sided pain, different location from first. Advd pt to keep appt with Dr. Georgina Snell to discuss 3/17. ?

## 2021-04-14 ENCOUNTER — Ambulatory Visit (INDEPENDENT_AMBULATORY_CARE_PROVIDER_SITE_OTHER): Payer: PPO | Admitting: Family Medicine

## 2021-04-14 ENCOUNTER — Other Ambulatory Visit: Payer: Self-pay

## 2021-04-14 VITALS — BP 162/100 | HR 59 | Ht 73.0 in | Wt 220.0 lb

## 2021-04-14 DIAGNOSIS — G4762 Sleep related leg cramps: Secondary | ICD-10-CM

## 2021-04-14 DIAGNOSIS — M5416 Radiculopathy, lumbar region: Secondary | ICD-10-CM

## 2021-04-14 MED ORDER — GABAPENTIN 300 MG PO CAPS: 300.0000 mg | ORAL_CAPSULE | Freq: Three times a day (TID) | ORAL | 2 refills | Status: DC

## 2021-04-14 NOTE — Patient Instructions (Addendum)
Thank you for coming in today.  ? ?Please call [facility] at [phone] to schedule your spine injection.   ? ?I've sent a prescription for Gabapentin to your pharmacy.  ? ?Recheck back as needed ? ? ?

## 2021-04-20 ENCOUNTER — Ambulatory Visit
Admission: RE | Admit: 2021-04-20 | Discharge: 2021-04-20 | Disposition: A | Discharge status: Home / Self Care | Payer: PPO | Source: Ambulatory Visit | Attending: Family Medicine | Admitting: Family Medicine

## 2021-04-20 ENCOUNTER — Other Ambulatory Visit: Payer: Self-pay

## 2021-04-20 DIAGNOSIS — M5416 Radiculopathy, lumbar region: Secondary | ICD-10-CM

## 2021-04-20 DIAGNOSIS — M79662 Pain in left lower leg: Secondary | ICD-10-CM | POA: Diagnosis not present

## 2021-04-20 MED ORDER — METHYLPREDNISOLONE ACETATE 40 MG/ML INJ SUSP (RADIOLOG
80.0000 mg | Freq: Once | INTRAMUSCULAR | Status: AC
  Administered 2021-04-20: 80 mg via EPIDURAL

## 2021-04-20 MED ORDER — IOPAMIDOL (ISOVUE-M 200) INJECTION 41%
1.0000 mL | Freq: Once | INTRAMUSCULAR | Status: AC
  Administered 2021-04-20: 1 mL via EPIDURAL

## 2021-04-20 NOTE — Discharge Instructions (Signed)

## 2021-05-15 DIAGNOSIS — F5104 Psychophysiologic insomnia: Secondary | ICD-10-CM | POA: Diagnosis not present

## 2021-05-15 DIAGNOSIS — I2510 Atherosclerotic heart disease of native coronary artery without angina pectoris: Secondary | ICD-10-CM | POA: Diagnosis not present

## 2021-05-15 DIAGNOSIS — I10 Essential (primary) hypertension: Secondary | ICD-10-CM | POA: Diagnosis not present

## 2021-05-15 DIAGNOSIS — M1990 Unspecified osteoarthritis, unspecified site: Secondary | ICD-10-CM | POA: Diagnosis not present

## 2021-05-15 DIAGNOSIS — M79642 Pain in left hand: Secondary | ICD-10-CM | POA: Diagnosis not present

## 2021-05-15 DIAGNOSIS — F339 Major depressive disorder, recurrent, unspecified: Secondary | ICD-10-CM | POA: Diagnosis not present

## 2021-05-15 DIAGNOSIS — D72819 Decreased white blood cell count, unspecified: Secondary | ICD-10-CM | POA: Diagnosis not present

## 2021-05-15 DIAGNOSIS — M71349 Other bursal cyst, unspecified hand: Secondary | ICD-10-CM | POA: Diagnosis not present

## 2021-05-15 DIAGNOSIS — M5441 Lumbago with sciatica, right side: Secondary | ICD-10-CM | POA: Diagnosis not present

## 2021-05-15 DIAGNOSIS — M542 Cervicalgia: Secondary | ICD-10-CM | POA: Diagnosis not present

## 2021-05-15 DIAGNOSIS — J439 Emphysema, unspecified: Secondary | ICD-10-CM | POA: Diagnosis not present

## 2021-06-01 DIAGNOSIS — M67442 Ganglion, left hand: Secondary | ICD-10-CM | POA: Diagnosis not present

## 2021-06-01 DIAGNOSIS — M79645 Pain in left finger(s): Secondary | ICD-10-CM | POA: Diagnosis not present

## 2021-06-03 DIAGNOSIS — K219 Gastro-esophageal reflux disease without esophagitis: Secondary | ICD-10-CM | POA: Diagnosis not present

## 2021-06-19 DIAGNOSIS — H40053 Ocular hypertension, bilateral: Secondary | ICD-10-CM | POA: Diagnosis not present

## 2021-06-19 DIAGNOSIS — H5213 Myopia, bilateral: Secondary | ICD-10-CM | POA: Diagnosis not present

## 2021-06-22 ENCOUNTER — Ambulatory Visit: Payer: Self-pay

## 2021-06-22 ENCOUNTER — Ambulatory Visit: Payer: PPO | Admitting: Family Medicine

## 2021-06-22 VITALS — BP 164/90 | HR 65 | Ht 73.0 in | Wt 220.8 lb

## 2021-06-22 DIAGNOSIS — G8929 Other chronic pain: Secondary | ICD-10-CM | POA: Diagnosis not present

## 2021-06-22 DIAGNOSIS — M25551 Pain in right hip: Secondary | ICD-10-CM | POA: Diagnosis not present

## 2021-06-22 NOTE — Progress Notes (Signed)
I, [name], LAT, ATC, am serving as scribe for Dr. Lynne Leader.  Patrick Craig is a 71 y.o. male who presents to [place] at [facility] today for f/u of R hip pain that has flared up over the last couple weeks.  He was last seen by Dr. Georgina Snell on 04/14/21 w/ c/o L buttock pain w/ radiating pain into his L lower leg and was referred for a lumbar ESI that he had on 04/20/21 (L L5-S1).  He has also bee seen for R and L hip pain and lumbar radiculopathy and had a prior R GT steroid injection on 03/24/21.  He had a prior course of PT that he completed on 03/31/21.  Today, pt reports pain is along the lateral aspect of the R hip. Pt notes the pain worsens later in the day. Pt is leaving for [place] on Sunday for an 11 days trip.  Diagnostic testing: L-spine MRI- 03/19/21, 02/25/20, 01/06/18; L-spine XR- 02/17/21, 08/22/19, 05/30/18  Pertinent review of systems: No fevers or chills  Relevant historical information: CAD.   Exam:  BP (!) 164/90   Pulse 65   Ht '6\' 1"'$  (1.854 m)   Wt 220 lb 12.8 oz (100.2 kg)   SpO2 96%   BMI 29.13 kg/m  General: Well Developed, well nourished, and in no acute distress.   MSK: Right hip: Normal. Tender palpation at greater trochanter.  Normal hip motion.  Hip abduction strength mildly diminished 4+/5 with some pain.    Lab and Radiology Results  Procedure: Real-time Ultrasound Guided Injection of right lateral hip greater trochanter bursa Device: Philips Affiniti 50G Images permanently stored and available for review in PACS Verbal informed consent obtained.  Discussed risks and benefits of procedure. Warned about infection, bleeding, hyperglycemia damage to structures among others. Patient expresses understanding and agreement Time-out conducted.   Noted no overlying erythema, induration, or other signs of local infection.   Skin prepped in a sterile fashion.   Local anesthesia: Topical Ethyl chloride.   With sterile technique and under  real time ultrasound guidance: '240mg'$  of Kenalog and 2 mL of Marcaine injected into greater trochanter bursa. Fluid seen entering the bursa.   Completed without difficulty   Pain immediately resolved suggesting accurate placement of the medication.   Advised to call if fevers/chills, erythema, induration, drainage, or persistent bleeding.   Images permanently stored and available for review in the ultrasound unit.  Impression: Technically successful ultrasound guided injection.         Assessment and Plan: 71 y.o. male with right lateral hip pain thought to be due to greater trochanteric bursitis.  He is already had a pretty extensive treatment for this including trial of PT and now 2 injections.  Continue home exercise program.  Recheck back as needed.  If this pain should recur I would recommend next step to be likely MRI to further evaluate source of pain.  We can consider even extracorporeal shockwave treatment as a potential future option.  Return as needed.   PDMP not reviewed this encounter. Orders Placed This Encounter  Procedures   [place] LIMITED JOINT SPACE STRUCTURES LOW RIGHT(NO LINKED CHARGES)    Order Specific Question:   Reason for Exam (SYMPTOM  OR DIAGNOSIS REQUIRED)    Answer:   right hip pain    Order Specific Question:   Preferred imaging location?    Answer:   [facility]   No orders of the defined types were placed in  this encounter.    Discussed warning signs or symptoms. Please see discharge instructions. Patient expresses understanding.   The above documentation has been reviewed and is accurate and complete Lynne Leader, M.D.

## 2021-06-22 NOTE — Patient Instructions (Addendum)
Thank you for coming in today.   You received a steroid injection in your right hip today. Seek immediate medical attention if the joint becomes red, extremely painful, or is oozing fluid.   Enjoy your trip!  Check back as needed

## 2021-07-25 NOTE — Progress Notes (Signed)
   I, [name], LAT, ATC, am serving as scribe for Dr. Lynne Leader.  Patrick Craig is a 71 y.o. male who presents to [place] at [facility] today for L great toe pain.  He was last seen by Dr. Georgina Snell on 06/22/21 for recurrence R hip pain and had a GT steroid injection.  Today, pt reports L Great toe pain started when he was in [place], 2.5 weeks ago. Pt locates pain to the MTP joint of the L 1st and into the Great toe. Pt describes pain as a "bruise" type feeling.  This pain developed after he did a lot of walking while on vacation in [place].  He denies any history of gout.  L great toe swelling: yes Aggravating factors: walking, activity Treatments tried: ice massage, IBU   Pertinent review of systems: No fevers or chills  Relevant historical information: Sleep apnea.   Exam:  BP (!) 160/92   Pulse 65   Ht '6\' 1"'$  (1.854 m)   Wt 219 lb 12.8 oz (99.7 kg)   SpO2 95%   BMI 29.00 kg/m  General: Well Developed, well nourished, and in no acute distress.   MSK: Left foot: Swelling and tenderness at first MTP. Decreased range of motion first MTP.    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  No acute fractures are present. Await formal radiology review     Assessment and Plan: 71 y.o. male with left great toe pain.  Pain is probably due to exacerbation of underlying DJD.  Gout is a possibility.  Will check uric acid.  We will treat with prednisone and colchicine and great toe mobilization with postop shoe transitioning to turf toe insole.  Recheck in 1 month.   PDMP not reviewed this encounter. Orders Placed This Encounter  Procedures   DG Foot Complete Left    Standing Status:   Future    Number of Occurrences:   1    Standing Expiration Date:   07/27/2022    Order Specific Question:   Reason for Exam (SYMPTOM  OR DIAGNOSIS  REQUIRED)    Answer:   left foot pain    Order Specific Question:   Preferred imaging location?    Answer:   [facility]   Uric acid    Standing Status:   Future    Number of Occurrences:   1    Standing Expiration Date:   07/27/2022   Comprehensive metabolic panel    Standing Status:   Future    Number of Occurrences:   1    Standing Expiration Date:   07/27/2022   Meds ordered this encounter  Medications   predniSONE (DELTASONE) 50 MG tablet    Sig: Take 1 pill daily for 5 days    Dispense:  5 tablet    Refill:  0   colchicine 0.6 MG tablet    Sig: Take 1 tablet (0.6 mg total) by mouth daily.    Dispense:  30 tablet    Refill:  1     Discussed warning signs or symptoms. Please see discharge instructions. Patient expresses understanding.   The above documentation has been reviewed and is accurate and complete Lynne Leader, M.D.

## 2021-07-26 ENCOUNTER — Ambulatory Visit (INDEPENDENT_AMBULATORY_CARE_PROVIDER_SITE_OTHER): Payer: PPO

## 2021-07-26 ENCOUNTER — Ambulatory Visit: Payer: PPO | Admitting: Family Medicine

## 2021-07-26 VITALS — BP 160/92 | HR 65 | Ht 73.0 in | Wt 219.8 lb

## 2021-07-26 DIAGNOSIS — M79672 Pain in left foot: Secondary | ICD-10-CM

## 2021-07-26 LAB — COMPREHENSIVE METABOLIC PANEL
ALT: 26 U/L (ref 0–53)
AST: 22 U/L (ref 0–37)
Albumin: 4.1 g/dL (ref 3.5–5.2)
Alkaline Phosphatase: 74 U/L (ref 39–117)
BUN: 17 mg/dL (ref 6–23)
CO2: 29 mEq/L (ref 19–32)
Calcium: 9.1 mg/dL (ref 8.4–10.5)
Chloride: 105 mEq/L (ref 96–112)
Creatinine, Ser: 1.02 mg/dL (ref 0.40–1.50)
GFR: 74.5 mL/min (ref >60.00)
Glucose, Bld: 90 mg/dL (ref 70–99)
Potassium: 3.8 mEq/L (ref 3.5–5.1)
Sodium: 139 mEq/L (ref 135–145)
Total Bilirubin: 0.8 mg/dL (ref 0.2–1.2)
Total Protein: 6.8 g/dL (ref 6.0–8.3)

## 2021-07-26 LAB — URIC ACID: Uric Acid, Serum: 4.8 mg/dL (ref 4.0–7.8)

## 2021-07-26 MED ORDER — COLCHICINE 0.6 MG PO TABS: 0.6000 mg | ORAL_TABLET | Freq: Every day | ORAL | 1 refills | Status: DC

## 2021-07-26 MED ORDER — PREDNISONE 50 MG PO TABS: ORAL_TABLET | ORAL | 0 refills | Status: DC

## 2021-07-26 NOTE — Patient Instructions (Addendum)
Thank you for coming in today.   Please get an Xray today before you leave   Please get labs today before you leave   Meds sent to your pharmacy  Turf toe insole and post-op shoe  Check back in 1 month

## 2021-07-27 NOTE — Progress Notes (Signed)
Labs look normal.  Uric acid level is normal.  Gout is very unlikely.  I do not think you need to take the colchicine.

## 2021-07-27 NOTE — Progress Notes (Signed)
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  The arthritis is probably the source of your pain.

## 2021-08-03 ENCOUNTER — Other Ambulatory Visit: Payer: Self-pay | Admitting: Cardiology

## 2021-08-11 ENCOUNTER — Ambulatory Visit: Payer: PPO | Admitting: Family Medicine

## 2021-08-11 VITALS — BP 134/84 | HR 61 | Ht 73.0 in | Wt 219.4 lb

## 2021-08-11 DIAGNOSIS — M5416 Radiculopathy, lumbar region: Secondary | ICD-10-CM

## 2021-08-11 MED ORDER — PREDNISONE 50 MG PO TABS: 50.0000 mg | ORAL_TABLET | Freq: Every day | ORAL | 0 refills | Status: DC

## 2021-08-11 MED ORDER — GABAPENTIN 300 MG PO CAPS: 300.0000 mg | ORAL_CAPSULE | Freq: Three times a day (TID) | ORAL | 3 refills | Status: DC | PRN

## 2021-08-11 NOTE — Progress Notes (Signed)
I, Peterson Lombard, LAT, ATC acting as a scribe for Lynne Leader, MD.  Patrick Craig is a 71 y.o. male who presents to [place] at [facility] today for continued LBP w/ radiating pain into R leg. Pt was last seen by Dr. Georgina Snell on 07/26/21 for L Great toe pain. He was also previously seen by Dr. Georgina Snell on 04/14/21 w/ c/o L buttock pain w/ radiating pain into his L lower leg and was referred for a lumbar ESI that he had on 04/20/21 (L L5-S1).  He has also been seen for R and L hip pain and lumbar radiculopathy and had a prior R GT steroid injection on 03/24/21.  He had a prior course of PT that he completed on 03/31/21.  Today, pt reports he played golf 2 days ago and then after playing the pain started. Pt locates pain to the R buttock w/ radiating pain along the lateral thigh and then anterior-lateral lower leg.   Radiates: yes LE numbness/tingling: no LE weakness: yes Aggravates: standing, walking, laying down Treatments tried: IBU, heat  Dx imaging: 03/19/21 L-spine MRI  02/17/21 L-spine XR  08/22/19 L-spine XR  05/30/18 L-spine XR  Pertinent review of systems: No fevers or chills  Relevant historical information: Hyperlipidemia   Exam:  BP 134/84   Pulse 61   Ht '6\' 1"'$  (1.854 m)   Wt 219 lb 6.4 oz (99.5 kg)   SpO2 98%   BMI 28.95 kg/m  General: Well Developed, well nourished, and in no acute distress.   MSK: L-spine: Nontender midline normal lumbar motion.  Lower extremity strength is intact.    Lab and Radiology Results  EXAM: MRI LUMBAR SPINE WITHOUT CONTRAST   TECHNIQUE: Multiplanar, multisequence MR imaging of the lumbar spine was performed. No intravenous contrast was administered.   COMPARISON:  Lumbar spine MRI 02/25/2020   FINDINGS: Segmentation: Standard; the lowest formed disc space is designated L5-S1.   Alignment: There is trace retrolisthesis of L1 on L2 and grade 1 anterolisthesis of L4 on L5, unchanged. Alignment is  otherwise normal.   Vertebrae: Vertebral body heights are preserved. There is no marrow edema. There is no suspicious marrow signal abnormality. Postsurgical changes reflecting right laminectomy at L3-L4 are again seen.   Conus medullaris and cauda equina: Conus extends to the L1-L2 level. Conus and cauda equina appear normal.   Paraspinal and other soft tissues: Unremarkable.   Disc levels:   There is multilevel disc desiccation with mild loss of height at L1-L2 through L3-L4. There is advanced facet arthropathy in the lower lumbar spine, most severe at L3-L4 and L4-L5. There are associated bilateral effusions at both levels, left larger than right.   T12-L1: No significant spinal canal or neural foraminal stenosis.   L1-L2: There is a mild disc bulge and mild bilateral facet arthropathy resulting in mild right and no significant left neural foraminal stenosis and mild narrowing of the right subarticular zone without evidence of nerve root impingement, overall not significantly changed.   L2-L3: There is a mild disc bulge, degenerative endplate change, and bilateral facet arthropathy resulting in mild spinal canal stenosis with narrowing of the left subarticular zone but no evidence of frank nerve root impingement, and mild left worse than right neural foraminal stenosis. Findings are overall not significantly changed.   L3-L4: There is a diffuse disc bulge with a left foraminal component, and bulky bilateral facet arthropathy resulting in mild-to-moderate spinal canal stenosis with effacement of the subarticular zones, left  worse than right, and possible impingement of the traversing left L4 nerve root, and mild right and moderate left neural foraminal stenosis with possible contact of the exiting left L3 nerve root. Compared to the study from 2022, the spinal canal and left neural foraminal stenosis have improved.   L4-L5: There is grade 1 anterolisthesis with  uncovering of the disc posteriorly and a mild superimposed bulge, and bulky left worse than right facet arthropathy resulting in mild-to-moderate spinal canal stenosis with effacement of the left subarticular zone and possible impingement of the traversing L5 nerve root, and mild-to-moderate left and mild right neural foraminal stenosis. Compared to the prior study, the spinal canal stenosis has improved. A previously seen synovial cyst on the right is no longer present.   L5-S1: There is degenerative endplate change and mild bilateral facet arthropathy without significant spinal canal or neural foraminal stenosis.   IMPRESSION: 1. 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 There is moderate left neural foraminal stenosis at L3-L4 which also appears improved compared to the prior study. 2. Bulky facet arthropathy at L3-L4 and L4-L5 with left larger than right effusions at both levels, similar to the prior study. 3. Otherwise, multilevel degenerative changes throughout the remainder of the lumbar spine as detailed above without other high-grade spinal canal or neural foraminal stenosis.     Electronically Signed   By: Valetta Mole M.D.   On: 03/20/2021 09:03   I, Lynne Leader, personally (independently) visualized and performed the interpretation of the images attached in this note.     Assessment and Plan: 71 y.o. male with lumbar radiculopathy right leg in a L5 dermatomal pattern.  Fortunately his symptoms although quite bad earlier this week have improved or resolved spontaneously.  Plan for bit of watchful waiting.  We will go ahead and prescribe prednisone and gabapentin so he has a plan of action and medicines ready to go for the next time.  Next steps could include retrial of physical  therapy or even a repeat epidural steroid injection.  Recheck back with me as needed.   PDMP not reviewed this encounter. No orders of the defined types were placed in this encounter.  Meds ordered this encounter  Medications   predniSONE (DELTASONE) 50 MG tablet    Sig: Take 1 tablet (50 mg total) by mouth daily.    Dispense:  5 tablet    Refill:  0   gabapentin (NEURONTIN) 300 MG capsule    Sig: Take 1 capsule (300 mg total) by mouth 3 (three) times daily as needed.    Dispense:  90 capsule    Refill:  3     Discussed warning signs or symptoms. Please see discharge instructions. Patient expresses understanding.   .[email or web address]

## 2021-08-11 NOTE — Patient Instructions (Signed)
Thank you for coming in today.   I've sent a prescription for Gabapentin and Prednisone to your pharmacy.   Check back as needed

## 2021-08-15 ENCOUNTER — Ambulatory Visit: Payer: PPO | Admitting: Family Medicine

## 2021-08-21 ENCOUNTER — Ambulatory Visit: Payer: PPO | Admitting: Family Medicine

## 2021-09-14 ENCOUNTER — Telehealth: Payer: Self-pay | Admitting: Family Medicine

## 2021-09-14 ENCOUNTER — Other Ambulatory Visit: Payer: Self-pay

## 2021-09-14 DIAGNOSIS — M5416 Radiculopathy, lumbar region: Secondary | ICD-10-CM

## 2021-09-14 NOTE — Telephone Encounter (Signed)
Spoke to pt, who reported LBP and radicular symptoms returned once he finished the prednisone. Pt reports he is miserable when trying to work outside. Pt has been diligent about working on HEP and preferred the option of repeat ESI. ESI order was placed.

## 2021-09-14 NOTE — Telephone Encounter (Signed)
Patient called stating that he is still having continued pain in his back that runs down his leg. He asked what the next steps would be?  Please advise.

## 2021-09-15 ENCOUNTER — Other Ambulatory Visit: Payer: Self-pay | Admitting: Cardiology

## 2021-09-18 ENCOUNTER — Other Ambulatory Visit: Payer: PPO

## 2021-09-21 ENCOUNTER — Ambulatory Visit
Admission: RE | Admit: 2021-09-21 | Discharge: 2021-09-21 | Disposition: A | Discharge status: Home / Self Care | Payer: PPO | Source: Ambulatory Visit | Attending: Family Medicine | Admitting: Family Medicine

## 2021-09-21 DIAGNOSIS — M5416 Radiculopathy, lumbar region: Secondary | ICD-10-CM

## 2021-09-21 DIAGNOSIS — M47817 Spondylosis without myelopathy or radiculopathy, lumbosacral region: Secondary | ICD-10-CM | POA: Diagnosis not present

## 2021-09-21 MED ORDER — METHYLPREDNISOLONE ACETATE 40 MG/ML INJ SUSP (RADIOLOG
80.0000 mg | Freq: Once | INTRAMUSCULAR | Status: AC
  Administered 2021-09-21: 80 mg via EPIDURAL

## 2021-09-21 MED ORDER — IOPAMIDOL (ISOVUE-M 200) INJECTION 41%
1.0000 mL | Freq: Once | INTRAMUSCULAR | Status: AC
  Administered 2021-09-21: 1 mL via EPIDURAL

## 2021-09-21 NOTE — Discharge Instructions (Signed)

## 2021-10-19 ENCOUNTER — Other Ambulatory Visit: Payer: Self-pay | Admitting: Cardiology

## 2021-12-01 DIAGNOSIS — J449 Chronic obstructive pulmonary disease, unspecified: Secondary | ICD-10-CM | POA: Diagnosis not present

## 2021-12-01 DIAGNOSIS — Z23 Encounter for immunization: Secondary | ICD-10-CM | POA: Diagnosis not present

## 2021-12-01 DIAGNOSIS — Z87891 Personal history of nicotine dependence: Secondary | ICD-10-CM | POA: Diagnosis not present

## 2021-12-12 DIAGNOSIS — Z87891 Personal history of nicotine dependence: Secondary | ICD-10-CM | POA: Diagnosis not present

## 2022-02-09 ENCOUNTER — Telehealth: Payer: Self-pay | Admitting: Family Medicine

## 2022-02-09 DIAGNOSIS — M5416 Radiculopathy, lumbar region: Secondary | ICD-10-CM

## 2022-02-09 NOTE — Telephone Encounter (Signed)
Patient called asking if another epidural could be ordered for him?  Please advise.

## 2022-02-13 DIAGNOSIS — Z125 Encounter for screening for malignant neoplasm of prostate: Secondary | ICD-10-CM | POA: Diagnosis not present

## 2022-02-13 DIAGNOSIS — E785 Hyperlipidemia, unspecified: Secondary | ICD-10-CM | POA: Diagnosis not present

## 2022-02-13 NOTE — Telephone Encounter (Signed)
Epidural steroid injection ordered 

## 2022-02-13 NOTE — Telephone Encounter (Signed)
Called and informed pt.  

## 2022-02-16 DIAGNOSIS — K219 Gastro-esophageal reflux disease without esophagitis: Secondary | ICD-10-CM | POA: Diagnosis not present

## 2022-02-16 DIAGNOSIS — D72819 Decreased white blood cell count, unspecified: Secondary | ICD-10-CM | POA: Diagnosis not present

## 2022-02-16 DIAGNOSIS — Z23 Encounter for immunization: Secondary | ICD-10-CM | POA: Diagnosis not present

## 2022-02-16 DIAGNOSIS — I10 Essential (primary) hypertension: Secondary | ICD-10-CM | POA: Diagnosis not present

## 2022-02-16 DIAGNOSIS — I2510 Atherosclerotic heart disease of native coronary artery without angina pectoris: Secondary | ICD-10-CM | POA: Diagnosis not present

## 2022-02-16 DIAGNOSIS — N529 Male erectile dysfunction, unspecified: Secondary | ICD-10-CM | POA: Diagnosis not present

## 2022-02-16 DIAGNOSIS — F339 Major depressive disorder, recurrent, unspecified: Secondary | ICD-10-CM | POA: Diagnosis not present

## 2022-02-16 DIAGNOSIS — I700 Atherosclerosis of aorta: Secondary | ICD-10-CM | POA: Diagnosis not present

## 2022-02-16 DIAGNOSIS — J439 Emphysema, unspecified: Secondary | ICD-10-CM | POA: Diagnosis not present

## 2022-02-16 DIAGNOSIS — J309 Allergic rhinitis, unspecified: Secondary | ICD-10-CM | POA: Diagnosis not present

## 2022-02-16 DIAGNOSIS — E785 Hyperlipidemia, unspecified: Secondary | ICD-10-CM | POA: Diagnosis not present

## 2022-02-16 DIAGNOSIS — Z0000 Encounter for general adult medical examination without abnormal findings: Secondary | ICD-10-CM | POA: Diagnosis not present

## 2022-02-22 DIAGNOSIS — L659 Nonscarring hair loss, unspecified: Secondary | ICD-10-CM | POA: Diagnosis not present

## 2022-04-04 DIAGNOSIS — L570 Actinic keratosis: Secondary | ICD-10-CM | POA: Diagnosis not present

## 2022-04-04 DIAGNOSIS — D225 Melanocytic nevi of trunk: Secondary | ICD-10-CM | POA: Diagnosis not present

## 2022-04-04 DIAGNOSIS — L738 Other specified follicular disorders: Secondary | ICD-10-CM | POA: Diagnosis not present

## 2022-04-04 DIAGNOSIS — B351 Tinea unguium: Secondary | ICD-10-CM | POA: Diagnosis not present

## 2022-04-04 DIAGNOSIS — L711 Rhinophyma: Secondary | ICD-10-CM | POA: Diagnosis not present

## 2022-10-16 ENCOUNTER — Other Ambulatory Visit: Payer: Self-pay | Admitting: Family Medicine

## 2022-10-19 ENCOUNTER — Other Ambulatory Visit: Payer: Self-pay | Admitting: Family Medicine

## 2022-10-22 NOTE — Telephone Encounter (Signed)
Pt has not been seen in > 1 year, needs OV.

## 2022-10-26 ENCOUNTER — Other Ambulatory Visit: Payer: Self-pay | Admitting: Family Medicine

## 2022-10-26 DIAGNOSIS — H2513 Age-related nuclear cataract, bilateral: Secondary | ICD-10-CM | POA: Diagnosis not present

## 2022-10-26 DIAGNOSIS — H40053 Ocular hypertension, bilateral: Secondary | ICD-10-CM | POA: Diagnosis not present

## 2022-10-26 DIAGNOSIS — H524 Presbyopia: Secondary | ICD-10-CM | POA: Diagnosis not present

## 2022-10-29 NOTE — Telephone Encounter (Signed)
Last OV 08/11/21 Next OV not scheduled  Pt needs OV, it has been > 1 year since last visit.

## 2022-12-04 DIAGNOSIS — I10 Essential (primary) hypertension: Secondary | ICD-10-CM | POA: Diagnosis not present

## 2022-12-04 DIAGNOSIS — E663 Overweight: Secondary | ICD-10-CM | POA: Diagnosis not present

## 2022-12-04 DIAGNOSIS — F339 Major depressive disorder, recurrent, unspecified: Secondary | ICD-10-CM | POA: Diagnosis not present

## 2022-12-04 DIAGNOSIS — E785 Hyperlipidemia, unspecified: Secondary | ICD-10-CM | POA: Diagnosis not present

## 2022-12-04 DIAGNOSIS — G4700 Insomnia, unspecified: Secondary | ICD-10-CM | POA: Diagnosis not present

## 2022-12-04 DIAGNOSIS — Z7982 Long term (current) use of aspirin: Secondary | ICD-10-CM | POA: Diagnosis not present

## 2022-12-04 DIAGNOSIS — K219 Gastro-esophageal reflux disease without esophagitis: Secondary | ICD-10-CM | POA: Diagnosis not present

## 2022-12-04 DIAGNOSIS — I2510 Atherosclerotic heart disease of native coronary artery without angina pectoris: Secondary | ICD-10-CM | POA: Diagnosis not present

## 2022-12-04 DIAGNOSIS — J449 Chronic obstructive pulmonary disease, unspecified: Secondary | ICD-10-CM | POA: Diagnosis not present

## 2022-12-04 DIAGNOSIS — Z87891 Personal history of nicotine dependence: Secondary | ICD-10-CM | POA: Diagnosis not present

## 2022-12-04 DIAGNOSIS — I700 Atherosclerosis of aorta: Secondary | ICD-10-CM | POA: Diagnosis not present

## 2022-12-12 ENCOUNTER — Ambulatory Visit: Payer: PPO | Admitting: Family Medicine

## 2022-12-12 VITALS — BP 182/98 | HR 64 | Ht 73.0 in | Wt 217.0 lb

## 2022-12-12 DIAGNOSIS — M5416 Radiculopathy, lumbar region: Secondary | ICD-10-CM | POA: Insufficient documentation

## 2022-12-12 MED ORDER — GABAPENTIN 300 MG PO CAPS: 300.0000 mg | ORAL_CAPSULE | Freq: Three times a day (TID) | ORAL | 3 refills | Status: DC | PRN

## 2022-12-12 NOTE — Progress Notes (Signed)
   Rubin Payor, PhD, LAT, ATC acting as a scribe for Clementeen Graham, MD.  Patrick Craig is a 72 y.o. male who presents to [facility] at [facility] today for cont'd lumbar radiculopathy and rx refill. Pt was last seen by Dr. Denyse Amass on 08/11/21 and was prescribed gabapentin and prednisone for future flares. Last lumbar ESI was on 09/21/21.  Today, pt reports he's been doing pretty good overall, only needing to take gabapentin very intermittently. Golf and mowing seem to exacerbate his symptoms. He his working on HEP at [employer].  Dx imaging: 03/19/21 L-spine MRI             02/17/21 L-spine XR             08/22/19 L-spine XR             05/30/18 L-spine XR  Pertinent review of systems: No fevers or chills  Relevant historical information: Coronary artery disease   Exam:  BP (!) 182/98   Pulse 64   Ht 6\' 1"  (1.854 m)   Wt 217 lb (98.4 kg)   SpO2 97%   BMI 28.63 kg/m  General: Well Developed, well nourished, and in no acute distress.   MSK: L-spine nontender to palpation normal lumbar motion. Lower extremity strength is intact.    Assessment and Plan: 72 y.o. male with lumbar radiculopathy occasionally involving both legs.  Symptoms are intermittent but chronic.  He is doing well with the current treatment of intermittent occasional gabapentin.  Could represcribe prednisone if needed but would like to hold off on that if possible.  He does take ibuprofen and his blood pressure is elevated.  I notified him that ibuprofen can increase blood pressure and does have some heart risk and recommended that he use Tylenol for the most part for pain control if possible. We could reorder the epidural steroid injection if needed but for now he seems to be doing pretty well with current treatment of gabapentin intermittently.  PDMP not reviewed this encounter. No orders of the defined types were placed in this encounter.  Meds ordered this encounter  Medications   gabapentin  (NEURONTIN) 300 MG capsule    Sig: Take 1 capsule (300 mg total) by mouth 3 (three) times daily as needed.    Dispense:  90 capsule    Refill:  3     Discussed warning signs or symptoms. Please see discharge instructions. Patient expresses understanding.   The above documentation has been reviewed and is accurate and complete Clementeen Graham, M.D. Total encounter time 20 minutes including face-to-face time with the patient and, reviewing past medical record, and charting on the date of service.

## 2022-12-12 NOTE — Patient Instructions (Signed)
Thank you for coming in today.   Use the gabapentin as needed.   We could do a prednisone if needed.   Recheck with me needed.

## 2022-12-18 DIAGNOSIS — I700 Atherosclerosis of aorta: Secondary | ICD-10-CM | POA: Diagnosis not present

## 2022-12-18 DIAGNOSIS — I2510 Atherosclerotic heart disease of native coronary artery without angina pectoris: Secondary | ICD-10-CM | POA: Diagnosis not present

## 2022-12-18 DIAGNOSIS — Z87891 Personal history of nicotine dependence: Secondary | ICD-10-CM | POA: Diagnosis not present

## 2022-12-18 DIAGNOSIS — F17210 Nicotine dependence, cigarettes, uncomplicated: Secondary | ICD-10-CM | POA: Diagnosis not present

## 2022-12-18 DIAGNOSIS — J439 Emphysema, unspecified: Secondary | ICD-10-CM | POA: Diagnosis not present

## 2022-12-25 DIAGNOSIS — R058 Other specified cough: Secondary | ICD-10-CM | POA: Diagnosis not present

## 2022-12-25 DIAGNOSIS — J449 Chronic obstructive pulmonary disease, unspecified: Secondary | ICD-10-CM | POA: Diagnosis not present

## 2022-12-25 DIAGNOSIS — Z23 Encounter for immunization: Secondary | ICD-10-CM | POA: Diagnosis not present

## 2022-12-31 DIAGNOSIS — L905 Scar conditions and fibrosis of skin: Secondary | ICD-10-CM | POA: Diagnosis not present

## 2023-02-06 DIAGNOSIS — Z87891 Personal history of nicotine dependence: Secondary | ICD-10-CM | POA: Diagnosis not present

## 2023-02-06 DIAGNOSIS — J449 Chronic obstructive pulmonary disease, unspecified: Secondary | ICD-10-CM | POA: Diagnosis not present

## 2023-02-18 DIAGNOSIS — I10 Essential (primary) hypertension: Secondary | ICD-10-CM | POA: Diagnosis not present

## 2023-02-18 DIAGNOSIS — Z125 Encounter for screening for malignant neoplasm of prostate: Secondary | ICD-10-CM | POA: Diagnosis not present

## 2023-02-18 DIAGNOSIS — E785 Hyperlipidemia, unspecified: Secondary | ICD-10-CM | POA: Diagnosis not present

## 2023-02-21 DIAGNOSIS — I2510 Atherosclerotic heart disease of native coronary artery without angina pectoris: Secondary | ICD-10-CM | POA: Diagnosis not present

## 2023-02-21 DIAGNOSIS — R351 Nocturia: Secondary | ICD-10-CM | POA: Diagnosis not present

## 2023-02-21 DIAGNOSIS — F339 Major depressive disorder, recurrent, unspecified: Secondary | ICD-10-CM | POA: Diagnosis not present

## 2023-02-21 DIAGNOSIS — I10 Essential (primary) hypertension: Secondary | ICD-10-CM | POA: Diagnosis not present

## 2023-02-21 DIAGNOSIS — Z0000 Encounter for general adult medical examination without abnormal findings: Secondary | ICD-10-CM | POA: Diagnosis not present

## 2023-02-21 DIAGNOSIS — I700 Atherosclerosis of aorta: Secondary | ICD-10-CM | POA: Diagnosis not present

## 2023-02-21 DIAGNOSIS — J439 Emphysema, unspecified: Secondary | ICD-10-CM | POA: Diagnosis not present

## 2023-02-21 DIAGNOSIS — J309 Allergic rhinitis, unspecified: Secondary | ICD-10-CM | POA: Diagnosis not present

## 2023-02-21 DIAGNOSIS — K219 Gastro-esophageal reflux disease without esophagitis: Secondary | ICD-10-CM | POA: Diagnosis not present

## 2023-02-21 DIAGNOSIS — N529 Male erectile dysfunction, unspecified: Secondary | ICD-10-CM | POA: Diagnosis not present

## 2023-04-02 ENCOUNTER — Other Ambulatory Visit: Payer: Self-pay

## 2023-04-02 ENCOUNTER — Ambulatory Visit
Admission: EM | Admit: 2023-04-02 | Discharge: 2023-04-02 | Disposition: A | Discharge status: Home / Self Care | Attending: Family Medicine | Admitting: Family Medicine

## 2023-04-02 DIAGNOSIS — B349 Viral infection, unspecified: Secondary | ICD-10-CM | POA: Diagnosis not present

## 2023-04-02 LAB — POC COVID19/FLU A&B COMBO
Covid Antigen, POC: NEGATIVE
Influenza A Antigen, POC: NEGATIVE
Influenza B Antigen, POC: NEGATIVE

## 2023-04-02 NOTE — Discharge Instructions (Signed)
 Do a home COVID and flu test tomorrow if you develop fever or worsening symptoms cough.  Go to the emergency room if you develop chest pain shortness of breath passing out etc.

## 2023-04-02 NOTE — ED Triage Notes (Signed)
 Pt presents with complaints of generalized body aches and chest tightness that began yesterday afternoon. Pt does report a cough however this is not abnormal for him. Pt currently denies pain. OTC Mucinex taken at home with some improvement in symptoms. Unsure of fevers at home, "have been feeling more cold than usual."

## 2023-04-02 NOTE — ED Provider Notes (Signed)
 [facility]    CSN: [id number] Arrival date & time: 04/02/23  1240      History   Chief Complaint Chief Complaint  Patient presents with   Generalized Body Aches    HPI Patrick Craig is a 73 y.o. male.   The history is provided by the patient.  Not feeling well today symptoms include body aches and fatigue, was unable to complete 18 holes of golf today. He has a cough due to his COPD, cough is unchanged. Denies fever, chills, rhinorrhea, nasal congestion, sore throat, headache, dizziness or lightheadedness, abdominal pain, nausea, vomiting, diarrhea, palpitations, shortness of breath.  Denies household contacts with illness.  States was sick 2 weeks ago with what he thought might have been a sinus infection but those symptoms fully resolved.  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  Benign appearance.  Moderate centrilobular emphysema with diffuse bronchial thickening (chronic bronchitis).   Coronary artery calcification seen on CAT scan 10/2019   Chest CT 11/26/2019:  Three-vessel coronary atherosclerosis and aortic atherosclerosis; 03/07/2020: Coronary Calcium Score 250.   Depression    Erectile dysfunction    History of kidney stones    "30 years ago"   Hyperlipemia    Recently started on statin   Rosacea    Sleep apnea    Thoracic aorta atherosclerosis (HCC)    Seen on CT scan   Vitamin D deficiency     Patient Active Problem List   Diagnosis Date Noted   Lumbar radiculopathy 12/12/2022   Elevated blood pressure reading without diagnosis of hypertension 04/24/2020   Coronary artery calcification seen on CAT scan 02/22/2020   Synovial cyst 05/30/2018   OSA (obstructive sleep apnea) 04/06/2016   ED (erectile dysfunction) 04/20/2015   Muscle cramps 04/20/2015   Cellulitis of arm 08/06/2014   COPD, mild (HCC)  08/06/2014   Depression 08/06/2014   Hyperlipemia, mixed 08/06/2014    Past Surgical History:  Procedure Laterality Date   APPENDECTOMY  1983   HERNIA REPAIR Left 2000   inguinal   INCISION AND DRAINAGE ABSCESS     KNEE ARTHROSCOPY Left    Medial meniscus   LUMBAR LAMINECTOMY/DECOMPRESSION MICRODISCECTOMY Right 05/30/2018   Procedure: Right Lumbar Three-Four Laminectomy for synovial cyst;  Surgeon: Donalee Citrin, MD;  Location: [facility] OR;  Service: Neurosurgery;  Laterality: Right;  posterior   SHOULDER SURGERY Bilateral    Clavicle surgeries       Home Medications    Prior to Admission medications   Medication Sig Start Date End Date Taking? Authorizing Provider  aspirin 81 MG chewable tablet Chew 81 mg by mouth daily. 02/09/20   [provider]  buPROPion (WELLBUTRIN XL) 300 MG 24 hr tablet Take 300 mg by mouth every other day.    [provider]  cholecalciferol (VITAMIN D3) 25 MCG (1000 UT) tablet Take 1,000 Units by mouth.     [provider]  ezetimibe (ZETIA) 10 MG tablet TAKE 1 TABLET (10 MG TOTAL) BY MOUTH DAILY. SCHEDULE OFFICE VISIT FOR FUTURE REFILLS. 10/19/21   Marykay Lex, MD  Fluticasone-Salmeterol (ADVAIR) 250-50 MCG/DOSE AEPB Inhale 1 puff into the lungs daily.     [provider]  gabapentin (NEURONTIN) 300 MG capsule Take 1 capsule (300 mg total) by mouth 3 (three) times daily as needed. 12/12/22   Clementeen Graham  S, MD  guaiFENesin (MUCINEX) 600 MG 12 hr tablet Take 600 mg by mouth daily as needed for cough.    [provider]  rosuvastatin (CRESTOR) 20 MG tablet Take 20 mg by mouth daily. 02/09/20   [provider]  sildenafil (REVATIO) 20 MG tablet Take 20 mg by mouth daily as needed (erectile dysfunction).    [provider]  traZODone (DESYREL) 50 MG tablet 50 mg at bedtime as needed for sleep. 03/24/20   [provider]    Family History Family History  Problem Relation Age of Onset    Arthritis Mother    Failure to thrive Father 21   Arthritis Father    Down syndrome Brother    Colon cancer Brother 64   Healthy Daughter     Social History Social History   Tobacco Use   Smoking status: Former    Current packs/day: 0.00    Types: Cigarettes    Quit date: 05/26/2008    Years since quitting: 14.8   Smokeless tobacco: Never  Vaping Use   Vaping status: Never Used  Substance Use Topics   Alcohol use: Yes    Comment: 1-2 drinks every 1-2 days   Drug use: Never     Allergies   Erythromycin base and Penicillins   Review of Systems Review of Systems   Physical Exam Triage Vital Signs ED Triage Vitals  Encounter Vitals Group     BP 04/02/23 1256 128/80     Systolic BP Percentile --      Diastolic BP Percentile --      Pulse Rate 04/02/23 1256 72     Resp 04/02/23 1256 18     Temp 04/02/23 1256 97.6 F (36.4 C)     Temp Source 04/02/23 1256 Oral     SpO2 04/02/23 1256 95 %     Weight 04/02/23 1256 217 lb (98.4 kg)     Height --      Head Circumference --      Peak Flow --      Pain Score 04/02/23 1305 0     Pain Loc --      Pain Education --      Exclude from Growth Chart --    No data found.  Updated Vital Signs BP 128/80 (BP Location: Right Arm)   Pulse 72   Temp 97.6 F (36.4 C) (Oral)   Resp 18   Wt 217 lb (98.4 kg)   SpO2 95%   BMI 28.63 kg/m   Visual Acuity Right Eye Distance:   Left Eye Distance:   Bilateral Distance:    Right Eye Near:   Left Eye Near:    Bilateral Near:     Physical Exam Vitals and nursing note reviewed.  Constitutional:      Appearance: He is not ill-appearing.  HENT:     Head: Normocephalic and atraumatic.     Ears:     Comments: Canals with cerumen TMs use obscured    Nose: No congestion or rhinorrhea.     Mouth/Throat:     Mouth: Mucous membranes are moist.     Pharynx: Oropharynx is clear.  Eyes:     General:        Right eye: No discharge.        Left eye: No discharge.      Conjunctiva/sclera: Conjunctivae normal.  Cardiovascular:     Rate and Rhythm: Normal rate and regular rhythm.     Heart sounds:  Normal heart sounds.  Pulmonary:     Effort: Pulmonary effort is normal. No respiratory distress.     Breath sounds: No wheezing or rales.  Musculoskeletal:     Cervical back: Neck supple.  Neurological:     Mental Status: He is alert and oriented to person, place, and time.      UC Treatments / Results  Labs (all labs ordered are listed, but only abnormal results are displayed) Labs Reviewed  POC COVID19/FLU A&B COMBO    EKG   Radiology No results found.  Procedures Procedures (including critical care time)  Medications Ordered in UC Medications - No data to display  Initial Impression / Assessment and Plan / UC Course  I have reviewed the triage vital signs and the nursing notes.  Pertinent labs & imaging results that were available during my care of the patient were reviewed by me and considered in my medical decision making (see chart for details).     73 year old male history of COPD presents with bodyaches and fatigue today, no documented fever, no known exposures.  He is well-appearing, nontoxic, well-hydrated.  No evidence of bacterial he infection on exam, lungs are clear to auscultation. Point of care COVID and flu are negative.  Recommend OTC meds for symptomatic relief, rest.  If worsening symptoms or fever recommend OTC COVID flu test tomorrow.  ED for severe symptoms or concerns specifically for chest pain, shortness of breath etc.  Final Clinical Impressions(s) / UC Diagnoses   Final diagnoses:  None   Discharge Instructions   None    ED Prescriptions   None    PDMP not reviewed this encounter.   Meliton Rattan, [place] 04/02/23 [phone]

## 2023-04-04 DIAGNOSIS — K219 Gastro-esophageal reflux disease without esophagitis: Secondary | ICD-10-CM | POA: Diagnosis not present

## 2023-04-04 DIAGNOSIS — Z6829 Body mass index (BMI) 29.0-29.9, adult: Secondary | ICD-10-CM | POA: Diagnosis not present

## 2023-05-05 IMAGING — MR MR LUMBAR SPINE W/O CM
4 of 5 series · 24 of 48 positions shown · non-contrast
Comparison: Lumbar spine MRI 02/25/2020

CLINICAL DATA: Low back pain radiating into right buttock and
bilateral legs, right worse than left. Right leg weakness for 1
year. History of laminectomy and cyst removal 3 years ago

EXAM:
MRI LUMBAR SPINE WITHOUT CONTRAST
TECHNIQUE: Multiplanar, multisequence MR imaging of the lumbar spine was
performed. No intravenous contrast was administered.

[Series 2: T2 · sagittal · 4.0mm · 0.81mm/px · 6 of 15 slices shown (1 of 2)]
[im 1/15]
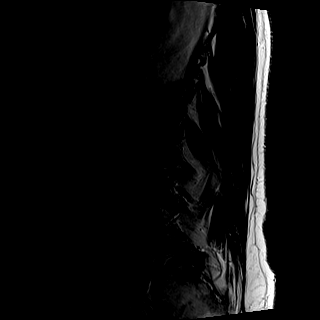
[im 3/15]
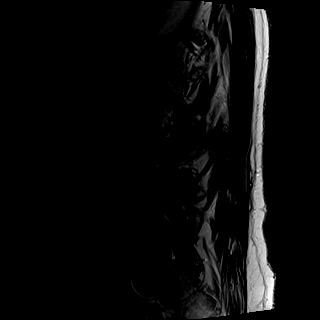
[im 6/15]
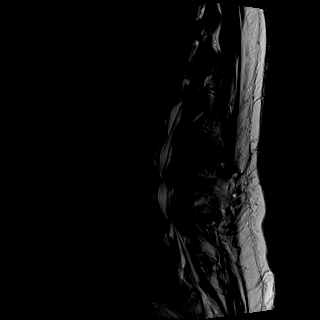
[im 9/15]
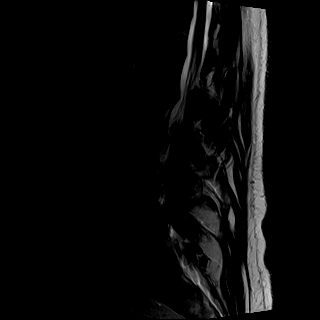
[im 12/15]
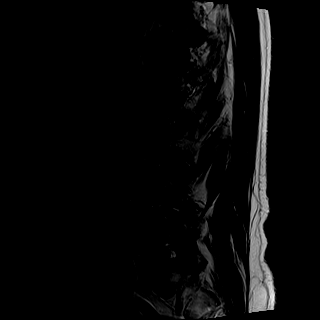
[im 15/15]
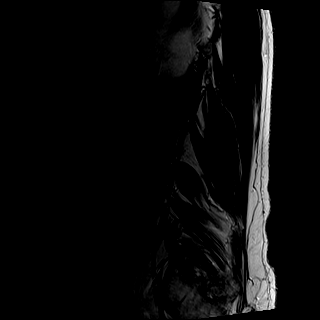

[Series 3: T1 · sagittal · 4.0mm · 0.41mm/px · 6 of 15 slices shown (1 of 2)]
[im 1/15]
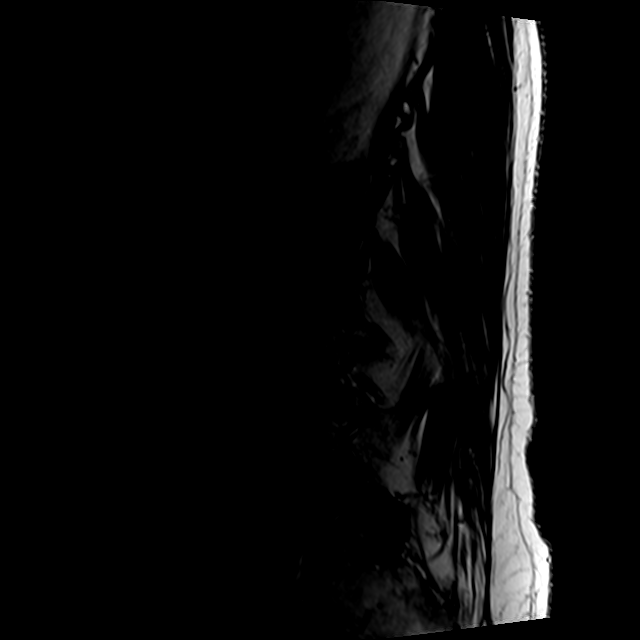
[im 3/15]
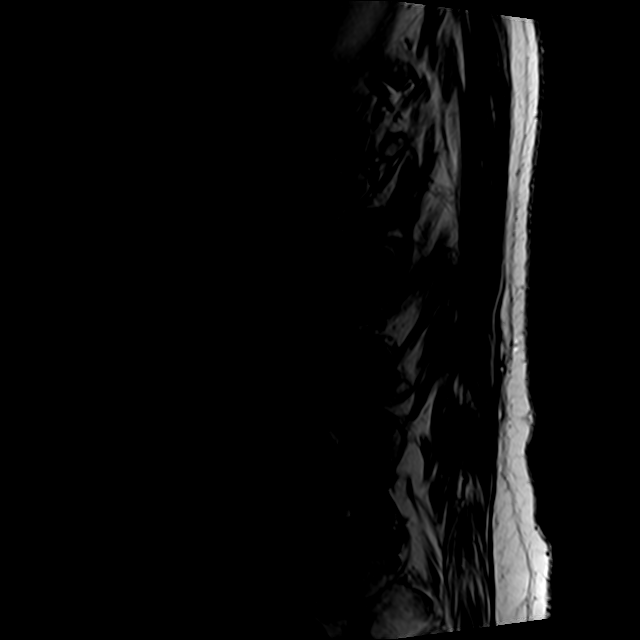
[im 6/15]
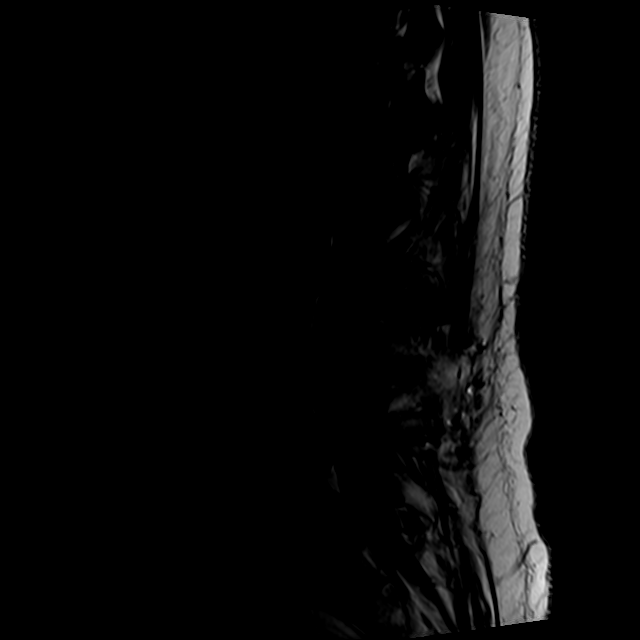
[im 9/15]
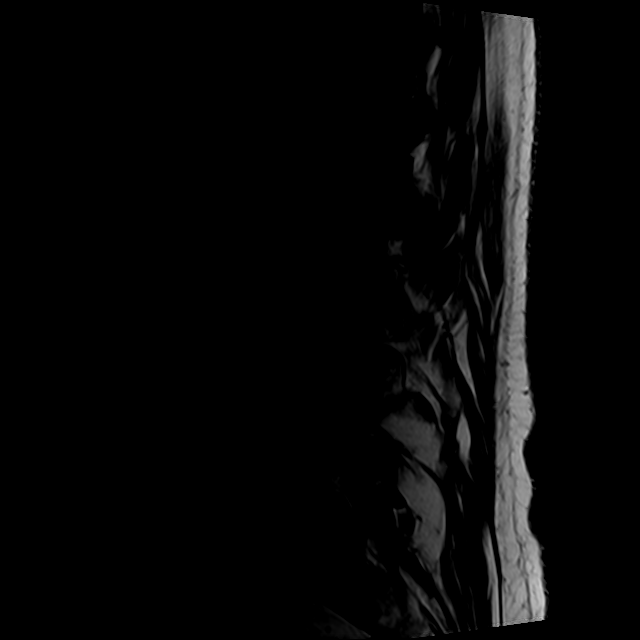
[im 12/15]
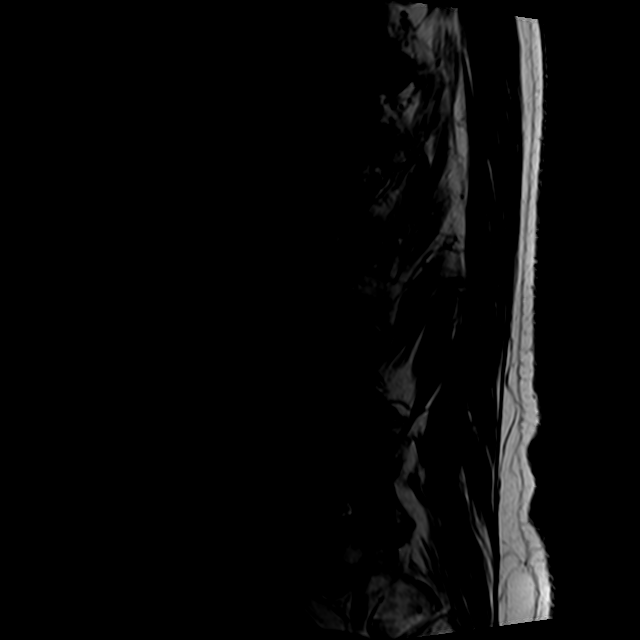
[im 15/15]
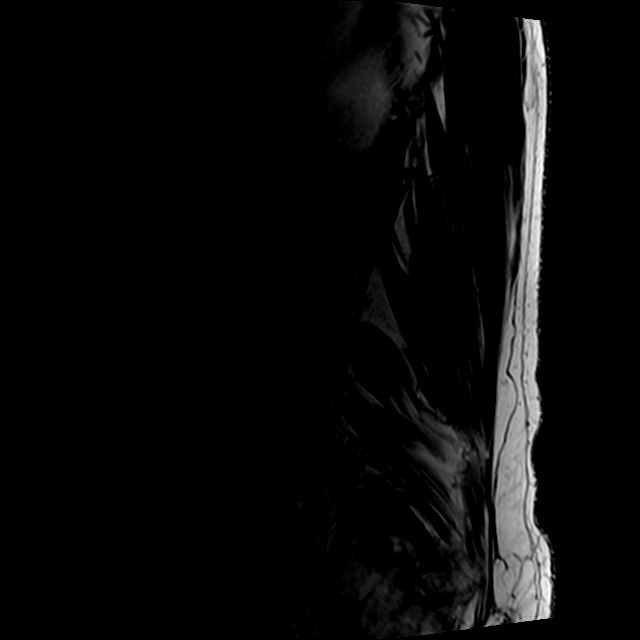

[Series 5: T2 · axial · 4.0mm · 0.78mm/px · z∈[-116,+94]mm · 9 of 38 slices shown (2 of 2)]
[im 1/38]
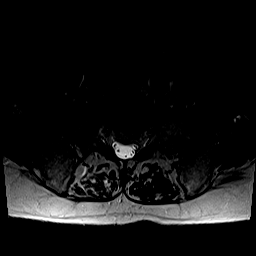
[im 6/38]
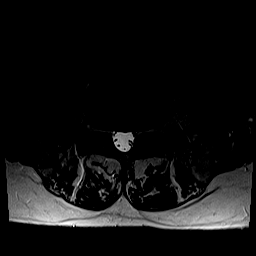
[im 11/38]
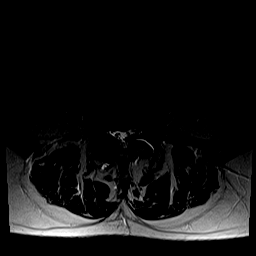
[im 16/38]
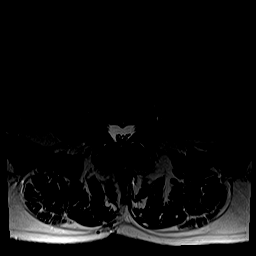
[im 19/38]
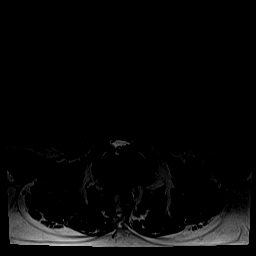
[im 22/38]
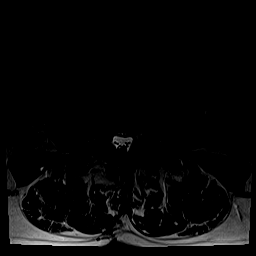
[im 27/38]
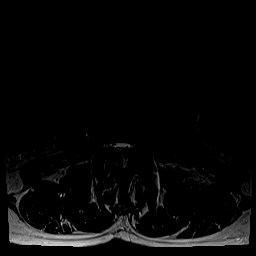
[im 32/38]
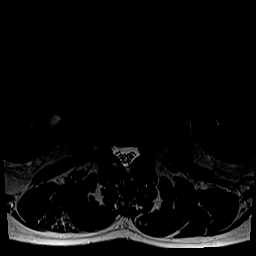
[im 38/38]
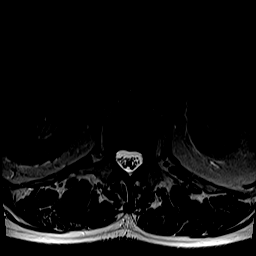

[Series 6: T1 · axial · 4.0mm · 0.39mm/px · z∈[-91,+64]mm · 3 of 38 slices shown (2 of 2)]
[im 6/38]
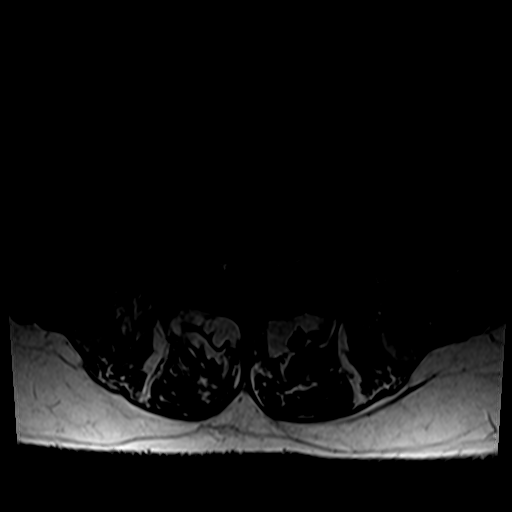
[im 19/38]
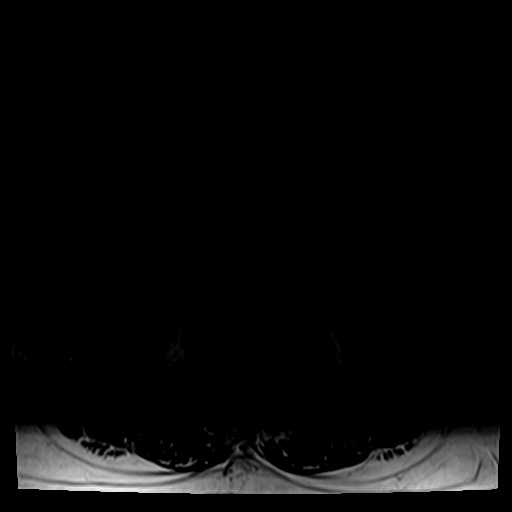
[im 32/38]
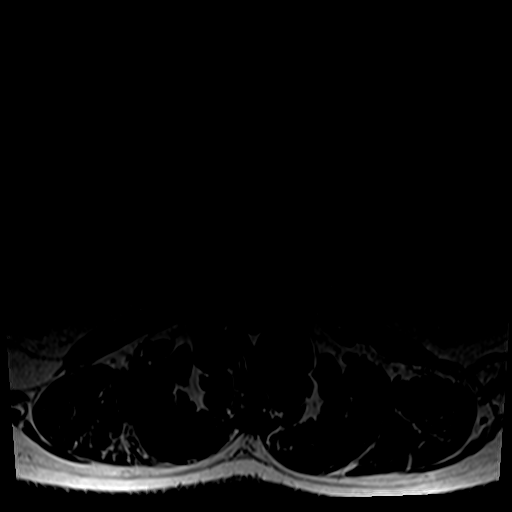

[24 of 48 positions shown; findings below may reference images not displayed]

FINDINGS: Segmentation: Standard; the lowest formed disc space is designated
L5-S1.

Alignment: There is trace retrolisthesis of L1 on L2 and grade 1
anterolisthesis of L4 on L5, unchanged. Alignment is otherwise
normal.

Vertebrae: Vertebral body heights are preserved. There is no marrow
edema. There is no suspicious marrow signal abnormality.
Postsurgical changes reflecting right laminectomy at L3-L4 are again
seen.

Conus medullaris and cauda equina: Conus extends to the L1-L2 level.
Conus and cauda equina appear normal.

Paraspinal and other soft tissues: Unremarkable.

Disc levels:

There is multilevel disc desiccation with mild loss of height at
L1-L2 through L3-L4. There is advanced facet arthropathy in the
lower lumbar spine, most severe at L3-L4 and L4-L5. There are
associated bilateral effusions at both levels, left larger than
right.

T12-L1: No significant spinal canal or neural foraminal stenosis.

L1-L2: There is a mild disc bulge and mild bilateral facet
arthropathy resulting in mild right and no significant left neural
foraminal stenosis and mild narrowing of the right subarticular zone
without evidence of nerve root impingement, overall not
significantly changed.

L2-L3: There is a mild disc bulge, degenerative endplate change, and
bilateral facet arthropathy resulting in mild spinal canal stenosis
with narrowing of the left subarticular zone but no evidence of
frank nerve root impingement, and mild left worse than right neural
foraminal stenosis. Findings are overall not significantly changed.

L3-L4: There is a diffuse disc bulge with a left foraminal
component, and bulky bilateral facet arthropathy resulting in
mild-to-moderate spinal canal stenosis with effacement of the
subarticular zones, left worse than right, and possible impingement
of the traversing left L4 nerve root, and mild right and moderate
left neural foraminal stenosis with possible contact of the exiting
left L3 nerve root. Compared to the study from 5155, the spinal
canal and left neural foraminal stenosis have improved.

L4-L5: There is grade 1 anterolisthesis with uncovering of the disc
posteriorly and a mild superimposed bulge, and bulky left worse than
right facet arthropathy resulting in mild-to-moderate spinal canal
stenosis with effacement of the left subarticular zone and possible
impingement of the traversing L5 nerve root, and mild-to-moderate
left and mild right neural foraminal stenosis. Compared to the prior
study, the spinal canal stenosis has improved. A previously seen
synovial cyst on the right is no longer present.

L5-S1: There is degenerative endplate change and mild bilateral
facet arthropathy without significant spinal canal or neural
foraminal stenosis.
IMPRESSION: 1. Compared to the study from 02/25/2020, spinal canal stenosis at
L3-L4 and L4-L5 has improved, though there remains effacement of the
left subarticular zone at both levels with possible impingement of
the traversing L4 and L5 nerve roots. There is moderate left neural
foraminal stenosis at L3-L4 which also appears improved compared to
the prior study.
2. Bulky facet arthropathy at L3-L4 and L4-L5 with left larger than
right effusions at both levels, similar to the prior study.
3. Otherwise, multilevel degenerative changes throughout the
remainder of the lumbar spine as detailed above without other
high-grade spinal canal or neural foraminal stenosis.

## 2023-06-04 DIAGNOSIS — L905 Scar conditions and fibrosis of skin: Secondary | ICD-10-CM | POA: Diagnosis not present

## 2023-06-04 DIAGNOSIS — B351 Tinea unguium: Secondary | ICD-10-CM | POA: Diagnosis not present

## 2023-06-04 DIAGNOSIS — B353 Tinea pedis: Secondary | ICD-10-CM | POA: Diagnosis not present

## 2023-06-04 DIAGNOSIS — M71341 Other bursal cyst, right hand: Secondary | ICD-10-CM | POA: Diagnosis not present

## 2023-06-04 DIAGNOSIS — L738 Other specified follicular disorders: Secondary | ICD-10-CM | POA: Diagnosis not present

## 2023-06-04 DIAGNOSIS — L821 Other seborrheic keratosis: Secondary | ICD-10-CM | POA: Diagnosis not present

## 2023-06-04 DIAGNOSIS — L814 Other melanin hyperpigmentation: Secondary | ICD-10-CM | POA: Diagnosis not present

## 2023-06-04 DIAGNOSIS — D225 Melanocytic nevi of trunk: Secondary | ICD-10-CM | POA: Diagnosis not present

## 2023-07-18 DIAGNOSIS — R051 Acute cough: Secondary | ICD-10-CM | POA: Diagnosis not present

## 2023-07-18 DIAGNOSIS — J101 Influenza due to other identified influenza virus with other respiratory manifestations: Secondary | ICD-10-CM | POA: Diagnosis not present

## 2023-07-18 DIAGNOSIS — R059 Cough, unspecified: Secondary | ICD-10-CM | POA: Diagnosis not present

## 2023-07-24 DIAGNOSIS — Z79899 Other long term (current) drug therapy: Secondary | ICD-10-CM | POA: Diagnosis not present

## 2023-07-24 DIAGNOSIS — Z800 Family history of malignant neoplasm of digestive organs: Secondary | ICD-10-CM | POA: Diagnosis not present

## 2023-07-24 DIAGNOSIS — Z1211 Encounter for screening for malignant neoplasm of colon: Secondary | ICD-10-CM | POA: Diagnosis not present

## 2023-07-24 DIAGNOSIS — K219 Gastro-esophageal reflux disease without esophagitis: Secondary | ICD-10-CM | POA: Diagnosis not present

## 2023-07-26 NOTE — Progress Notes (Unsigned)
   LILLETTE Ileana Collet, PhD, LAT, ATC acting as a scribe for Artist Lloyd, MD.  Patrick Craig is a 73 y.o. male who presents to [facility] at [facility] today for neck and shoulder pain. Pt was previously seen by Dr. Lloyd on 12/12/22 for lumbar radiculopathy.  Today, pt c/o neck and shoulder pain x ***. Pt locates pain to ***  Radiates:  UE Numbness/tingling: UE Weakness: Aggravates: Treatments tried:  Pertinent review of systems: ***  Relevant historical information: ***   Exam:  There were no vitals taken for this visit. General: Well Developed, well nourished, and in no acute distress.   MSK: ***    Lab and Radiology Results No results found for this or any previous visit (from the past 72 hours). No results found.     Assessment and Plan: 73 y.o. male with ***   PDMP not reviewed this encounter. No orders of the defined types were placed in this encounter.  No orders of the defined types were placed in this encounter.    Discussed warning signs or symptoms. Please see discharge instructions. Patient expresses understanding.   ***

## 2023-07-29 ENCOUNTER — Ambulatory Visit (INDEPENDENT_AMBULATORY_CARE_PROVIDER_SITE_OTHER)

## 2023-07-29 ENCOUNTER — Ambulatory Visit: Admitting: Family Medicine

## 2023-07-29 VITALS — BP 152/94 | HR 66 | Ht 73.0 in | Wt 224.0 lb

## 2023-07-29 DIAGNOSIS — M4802 Spinal stenosis, cervical region: Secondary | ICD-10-CM | POA: Diagnosis not present

## 2023-07-29 DIAGNOSIS — G8929 Other chronic pain: Secondary | ICD-10-CM

## 2023-07-29 DIAGNOSIS — M542 Cervicalgia: Secondary | ICD-10-CM

## 2023-07-29 DIAGNOSIS — G5602 Carpal tunnel syndrome, left upper limb: Secondary | ICD-10-CM | POA: Diagnosis not present

## 2023-07-29 DIAGNOSIS — M47812 Spondylosis without myelopathy or radiculopathy, cervical region: Secondary | ICD-10-CM | POA: Diagnosis not present

## 2023-07-29 DIAGNOSIS — M5030 Other cervical disc degeneration, unspecified cervical region: Secondary | ICD-10-CM | POA: Diagnosis not present

## 2023-07-29 NOTE — Patient Instructions (Addendum)
 Thank you for coming in today.   Please get an Xray today before you leave   Carpal tunnel wrist brace  I've referred you to Physical Therapy.  Let us  know if you don't hear from them in one week.

## 2023-07-30 ENCOUNTER — Ambulatory Visit: Attending: Family Medicine

## 2023-07-30 DIAGNOSIS — M62838 Other muscle spasm: Secondary | ICD-10-CM | POA: Insufficient documentation

## 2023-07-30 DIAGNOSIS — M5412 Radiculopathy, cervical region: Secondary | ICD-10-CM | POA: Insufficient documentation

## 2023-07-30 DIAGNOSIS — M542 Cervicalgia: Secondary | ICD-10-CM | POA: Insufficient documentation

## 2023-07-30 DIAGNOSIS — G8929 Other chronic pain: Secondary | ICD-10-CM | POA: Insufficient documentation

## 2023-07-30 NOTE — Therapy (Signed)
 OUTPATIENT PHYSICAL THERAPY CERVICAL EVALUATION   Patient Name: Patrick Craig MRN: [id number] DOB:1950/07/12, 73 y.o., male Today's Date: 07/30/2023  END OF SESSION:  PT End of Session - 07/30/23 1322     Visit Number 1    Date for PT Re-Evaluation 09/24/23    PT Start Time 1321    PT Stop Time 1400    PT Time Calculation (min) 39 min          Past Medical History:  Diagnosis Date   Cellulitis of arm, left 08/06/2014   Chronic bronchitis with emphysema (HCC) 10/2019   CT chest:  Lung RADS 2.  Benign appearance.  Moderate centrilobular emphysema with diffuse bronchial thickening (chronic bronchitis).   Coronary artery calcification seen on CAT scan 10/2019   Chest CT 11/26/2019:  Three-vessel coronary atherosclerosis and aortic atherosclerosis; 03/07/2020: Coronary Calcium  Score 250.   Depression    Erectile dysfunction    History of kidney stones    30 years ago   Hyperlipemia    Recently started on statin   Rosacea    Sleep apnea    Thoracic aorta atherosclerosis (HCC)    Seen on CT scan   Vitamin D deficiency    Past Surgical History:  Procedure Laterality Date   APPENDECTOMY  1983   HERNIA REPAIR Left 2000   inguinal   INCISION AND DRAINAGE ABSCESS     KNEE ARTHROSCOPY Left    Medial meniscus   LUMBAR LAMINECTOMY/DECOMPRESSION MICRODISCECTOMY Right 05/30/2018   Procedure: Right Lumbar Three-Four Laminectomy for synovial cyst;  Surgeon: Onetha Kuba, MD;  Location: [facility] OR;  Service: Neurosurgery;  Laterality: Right;  posterior   SHOULDER SURGERY Bilateral    Clavicle surgeries   Patient Active Problem List   Diagnosis Date Noted   Lumbar radiculopathy 12/12/2022   Elevated blood pressure reading without diagnosis of hypertension 04/24/2020   Coronary artery calcification seen on CAT scan 02/22/2020   Synovial cyst 05/30/2018   OSA (obstructive sleep apnea) 04/06/2016   ED (erectile dysfunction) 04/20/2015   Muscle cramps 04/20/2015   Cellulitis of arm  08/06/2014   COPD, mild (HCC) 08/06/2014   Depression 08/06/2014   Hyperlipemia, mixed 08/06/2014    PCP: Ryan Hives   REFERRING PROVIDER: Joane Birmingham  REFERRING DIAG: Chronic neck pain and shoulder  THERAPY DIAG:  Cervicalgia  Other muscle spasm  Cervical radiculopathy  Rationale for Evaluation and Treatment: Rehabilitation  ONSET DATE: a couple of months now  SUBJECTIVE:  SUBJECTIVE STATEMENT: The neck and shoulder are hurting for a couple of months now. It just started randomly and got worse. I don't recall doing anything to it. I do play golf at least once a week.    PERTINENT HISTORY:  Bilateral clavicle sx, lumbar laminectomy/decompression 2020  PAIN:  Are you having pain? Yes: NPRS scale: 7/10 Pain location: L upper trap, L side of neck Pain description: a little bit of tingling, dull, ache Aggravating factors: rotating it Relieving factors: tylenol  and advil   PRECAUTIONS: None  RED FLAGS: None     WEIGHT BEARING RESTRICTIONS: No  FALLS:  Has patient fallen in last 6 months? No  LIVING ENVIRONMENT: Lives with: lives with their family Lives in: House/apartment  OCCUPATION: retired  PLOF: Independent  PATIENT GOALS: to get rid of my pain  OBJECTIVE:  Note: Objective measures were completed at Evaluation unless otherwise noted.  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 No acute fractures are visible.    COGNITION: Overall cognitive status: Within functional limits for tasks assessed  SENSATION: WFL  POSTURE: rounded shoulders  PALPATION: Tightness in bilateral upper traps, some radiating sx into L arm when pushing on trigger points    CERVICAL ROM:   Active ROM A/PROM (deg) eval  Flexion WFL with pain on L side   Extension 50% with catch in neck  Right lateral flexion 25% with pain  Left lateral flexion 20% with pain  Right rotation 75%  Left rotation 50% with pain   (Blank rows = not tested)  UPPER EXTREMITY ROM: WFL   UPPER EXTREMITY MMT: 5/5 BUE  TREATMENT DATE: 07/30/23 EVAL                                                                                                                                PATIENT EDUCATION:  Education details: POC, HEP, traction Person educated: Patient Education method: Medical illustrator Education comprehension: verbalized understanding and returned demonstration  HOME EXERCISE PROGRAM: Access Code: [email or web address] URL: [email or web address] Date: 07/30/2023 Prepared by: Almetta Fam  Exercises - Seated Levator Scapulae Stretch  - 1 x daily - 7 x weekly - 2 reps - 15 hold - Seated Upper Trapezius Stretch  - 1 x daily - 7 x weekly - 2 reps - 15 hold - Median Nerve Flossing - Tray  - 1 x daily - 7 x weekly - 2 sets - 10 reps - Seated Assisted Cervical Rotation with Towel  - 1 x daily - 7 x weekly - 2 sets - 10 reps - 3 hold - Seated Cervical Traction  - 1 x daily - 7 x weekly - 10 reps - 10 hold  ASSESSMENT:  CLINICAL IMPRESSION: Patient is a 73 y.o. male who was seen today for physical therapy evaluation and treatment for chronic left lateral neck pain extending into the rhomboid region and trapezius with some pain radiating down the left arm. He has some numbness  and tingling into his hand.  He may have cervical radiculopathy and or carpal tunnel syndrome. He had some N/T with CPA and UPA to his c-spine. He reports doing some self traction and that seems to improve his symptoms. He has a at home pump up traction unit at home that he forgot he had. Patient has lots of crepitus and popping in his neck. He is limited with cervical ROM and has a lot of tightness in his upper traps. Patient will benefit from skilled PT to address his neck  pain and tightness to improve QOL and allow him to exercise and play golf normally.    OBJECTIVE IMPAIRMENTS: decreased ROM, increased fascial restrictions, increased muscle spasms, impaired flexibility, and pain.   ACTIVITY LIMITATIONS: carrying and lifting  PARTICIPATION LIMITATIONS: yard work and golf and exercising at [employer]  PERSONAL FACTORS: Time since onset of injury/illness/exacerbation are also affecting patient's functional outcome.   REHAB POTENTIAL: Good  CLINICAL DECISION MAKING: Stable/uncomplicated  EVALUATION COMPLEXITY: Low   GOALS: Goals reviewed with patient? Yes  SHORT TERM GOALS: Target date: 08/27/23  Patient will be independent with initial HEP.  Baseline:  Goal status: INITIAL   LONG TERM GOALS: Target date: 09/24/23  Patient will be independent with advanced/ongoing HEP to improve outcomes and carryover.  Baseline:  Goal status: INITIAL  2.  Patient will report 75% improvement in neck pain to improve QOL.  Baseline: 7/10 Goal status: INITIAL  3.  Patient will demonstrate full pain free cervical ROM. Baseline: see chart Goal status: INITIAL  4.  Patient will return to playing golf and doing regular exercise at [facility] without pain  Baseline: slowed down some at gym for now Goal status: INITIAL  PLAN:  PT FREQUENCY: 2x/week  PT DURATION: 8 weeks  PLANNED INTERVENTIONS: 97110-Therapeutic exercises, 97530- Therapeutic activity, 97112- Neuromuscular re-education, 97535- Self Care, 02859- Manual therapy, G0283- Electrical stimulation (unattended), 97012- Traction (mechanical), 20560 (1-2 muscles), 20561 (3+ muscles)- Dry Needling, Patient/Family education, Taping, Joint mobilization, Joint manipulation, Spinal manipulation, Spinal mobilization, Cryotherapy, and Moist heat  PLAN FOR NEXT SESSION: traction, STM to neck and rhomboids, DN if he wants   Almetta Fam, PT 07/30/2023, 3:02 PM

## 2023-07-30 NOTE — Therapy (Signed)
 OUTPATIENT PHYSICAL THERAPY CERVICAL TREATMENT   Patient Name: Patrick Craig MRN: [id number] DOB:07-Sep-1950, 73 y.o., male Today's Date: 07/31/2023  END OF SESSION:  PT End of Session - 07/31/23 0929     Visit Number 2    Date for PT Re-Evaluation 09/24/23    PT Start Time 0930    PT Stop Time 1015    PT Time Calculation (min) 45 min           Past Medical History:  Diagnosis Date   Cellulitis of arm, left 08/06/2014   Chronic bronchitis with emphysema (HCC) 10/2019   CT chest:  Lung RADS 2.  Benign appearance.  Moderate centrilobular emphysema with diffuse bronchial thickening (chronic bronchitis).   Coronary artery calcification seen on CAT scan 10/2019   Chest CT 11/26/2019:  Three-vessel coronary atherosclerosis and aortic atherosclerosis; 03/07/2020: Coronary Calcium  Score 250.   Depression    Erectile dysfunction    History of kidney stones    30 years ago   Hyperlipemia    Recently started on statin   Rosacea    Sleep apnea    Thoracic aorta atherosclerosis (HCC)    Seen on CT scan   Vitamin D deficiency    Past Surgical History:  Procedure Laterality Date   APPENDECTOMY  1983   HERNIA REPAIR Left 2000   inguinal   INCISION AND DRAINAGE ABSCESS     KNEE ARTHROSCOPY Left    Medial meniscus   LUMBAR LAMINECTOMY/DECOMPRESSION MICRODISCECTOMY Right 05/30/2018   Procedure: Right Lumbar Three-Four Laminectomy for synovial cyst;  Surgeon: Onetha Kuba, MD;  Location: [facility] OR;  Service: Neurosurgery;  Laterality: Right;  posterior   SHOULDER SURGERY Bilateral    Clavicle surgeries   Patient Active Problem List   Diagnosis Date Noted   Lumbar radiculopathy 12/12/2022   Elevated blood pressure reading without diagnosis of hypertension 04/24/2020   Coronary artery calcification seen on CAT scan 02/22/2020   Synovial cyst 05/30/2018   OSA (obstructive sleep apnea) 04/06/2016   ED (erectile dysfunction) 04/20/2015   Muscle cramps 04/20/2015   Cellulitis of arm  08/06/2014   COPD, mild (HCC) 08/06/2014   Depression 08/06/2014   Hyperlipemia, mixed 08/06/2014    PCP: Ryan Hives   REFERRING PROVIDER: Joane Birmingham  REFERRING DIAG: Chronic neck pain and shoulder  THERAPY DIAG:  Cervicalgia  Other muscle spasm  Cervical radiculopathy  Rationale for Evaluation and Treatment: Rehabilitation  ONSET DATE: a couple of months now  SUBJECTIVE:  SUBJECTIVE STATEMENT: I feel tight and stiff.   EVAL--The neck and shoulder are hurting for a couple of months now. It just started randomly and got worse. I don't recall doing anything to it. I do play golf at least once a week.    PERTINENT HISTORY:  Bilateral clavicle sx, lumbar laminectomy/decompression 2020  PAIN:  Are you having pain? Yes: NPRS scale: 7/10 Pain location: L upper trap, L side of neck Pain description: a little bit of tingling, dull, ache Aggravating factors: rotating it Relieving factors: tylenol  and advil   PRECAUTIONS: None  RED FLAGS: None     WEIGHT BEARING RESTRICTIONS: No  FALLS:  Has patient fallen in last 6 months? No  LIVING ENVIRONMENT: Lives with: lives with their family Lives in: House/apartment  OCCUPATION: retired  PLOF: Independent  PATIENT GOALS: to get rid of my pain  OBJECTIVE:  Note: Objective measures were completed at Evaluation unless otherwise noted.  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 No acute fractures are visible.    COGNITION: Overall cognitive status: Within functional limits for tasks assessed  SENSATION: WFL  POSTURE: rounded shoulders  PALPATION: Tightness in bilateral upper traps, some radiating sx into L arm when pushing on trigger points    CERVICAL ROM:   Active ROM A/PROM (deg) eval   Flexion WFL with pain on L side  Extension 50% with catch in neck  Right lateral flexion 25% with pain  Left lateral flexion 20% with pain  Right rotation 75%  Left rotation 50% with pain   (Blank rows = not tested)  UPPER EXTREMITY ROM: WFL   UPPER EXTREMITY MMT: 5/5 BUE  TREATMENT DATE: 07/31/23 UBE L2 x62mins each way  Scapular retraction red band 2x10 UT stretch holding 7# weight 30s each side  Shoulder shrugs 5# 2x10  Shoulder rolls 5# 2x10 STM to neck and rhomboids, use of theragun Cervical traction with MH 12# x47mins  07/30/23 EVAL                                                                                                                                PATIENT EDUCATION:  Education details: POC, HEP, traction Person educated: Patient Education method: Medical illustrator Education comprehension: verbalized understanding and returned demonstration  HOME EXERCISE PROGRAM: Access Code: [email or web address] URL: [email or web address] Date: 07/30/2023 Prepared by: Almetta Fam  Exercises - Seated Levator Scapulae Stretch  - 1 x daily - 7 x weekly - 2 reps - 15 hold - Seated Upper Trapezius Stretch  - 1 x daily - 7 x weekly - 2 reps - 15 hold - Median Nerve Flossing - Tray  - 1 x daily - 7 x weekly - 2 sets - 10 reps - Seated Assisted Cervical Rotation with Towel  - 1 x daily - 7 x weekly - 2 sets - 10 reps - 3 hold - Seated Cervical Traction  - 1 x daily - 7 x weekly -  10 reps - 10 hold  ASSESSMENT:  CLINICAL IMPRESSION: Patient is a 73 y.o. male who was seen today for physical therapy treatment for chronic left lateral neck pain extending into the rhomboid region and trapezius with some pain radiating down the left arm. He has some numbness and tingling into his L hand.  He may have cervical radiculopathy and or carpal tunnel syndrome. He reports compliance with HEP, and that it seemed to feel good after but is a little achy this morning. His upper  traps are over active and he needs cues to relax often. Patient reports massage gun was very helpful and relaxing. He does have some larger trigger points in his upper traps that radiate when pushed on. Tried traction today for tightness and N/T symptoms into L arm/hand. Patient will benefit from skilled PT to address his neck pain and tightness to improve QOL and allow him to exercise and play golf normally.    OBJECTIVE IMPAIRMENTS: decreased ROM, increased fascial restrictions, increased muscle spasms, impaired flexibility, and pain.   ACTIVITY LIMITATIONS: carrying and lifting  PARTICIPATION LIMITATIONS: yard work and golf and exercising at [employer]  PERSONAL FACTORS: Time since onset of injury/illness/exacerbation are also affecting patient's functional outcome.   REHAB POTENTIAL: Good  CLINICAL DECISION MAKING: Stable/uncomplicated  EVALUATION COMPLEXITY: Low   GOALS: Goals reviewed with patient? Yes  SHORT TERM GOALS: Target date: 08/27/23  Patient will be independent with initial HEP.  Baseline:  Goal status: INITIAL   LONG TERM GOALS: Target date: 09/24/23  Patient will be independent with advanced/ongoing HEP to improve outcomes and carryover.  Baseline:  Goal status: INITIAL  2.  Patient will report 75% improvement in neck pain to improve QOL.  Baseline: 7/10 Goal status: INITIAL  3.  Patient will demonstrate full pain free cervical ROM. Baseline: see chart Goal status: INITIAL  4.  Patient will return to playing golf and doing regular exercise at [facility] without pain  Baseline: slowed down some at gym for now Goal status: INITIAL  PLAN:  PT FREQUENCY: 2x/week  PT DURATION: 8 weeks  PLANNED INTERVENTIONS: 97110-Therapeutic exercises, 97530- Therapeutic activity, 97112- Neuromuscular re-education, 97535- Self Care, 02859- Manual therapy, G0283- Electrical stimulation (unattended), 97012- Traction (mechanical), 20560 (1-2 muscles), 20561 (3+ muscles)- Dry Needling,  Patient/Family education, Taping, Joint mobilization, Joint manipulation, Spinal manipulation, Spinal mobilization, Cryotherapy, and Moist heat  PLAN FOR NEXT SESSION: how was traction? STM to neck and rhomboids, DN if he wants, postural strengthening    Almetta Fam, PT 07/31/2023, 10:16 AM

## 2023-07-31 ENCOUNTER — Ambulatory Visit

## 2023-07-31 DIAGNOSIS — M5412 Radiculopathy, cervical region: Secondary | ICD-10-CM

## 2023-07-31 DIAGNOSIS — M542 Cervicalgia: Secondary | ICD-10-CM

## 2023-07-31 DIAGNOSIS — M62838 Other muscle spasm: Secondary | ICD-10-CM

## 2023-08-05 ENCOUNTER — Ambulatory Visit

## 2023-08-05 ENCOUNTER — Ambulatory Visit: Payer: Self-pay | Admitting: Family Medicine

## 2023-08-05 DIAGNOSIS — M62838 Other muscle spasm: Secondary | ICD-10-CM

## 2023-08-05 DIAGNOSIS — M5412 Radiculopathy, cervical region: Secondary | ICD-10-CM

## 2023-08-05 DIAGNOSIS — M542 Cervicalgia: Secondary | ICD-10-CM

## 2023-08-05 NOTE — Therapy (Signed)
 OUTPATIENT PHYSICAL THERAPY CERVICAL TREATMENT   Patient Name: Patrick Craig MRN: [id number] DOB:[birth year], 73 y.o., male Today's Date: 08/05/2023  END OF SESSION:  PT End of Session - 08/05/23 1446     Visit Number 3    Date for PT Re-Evaluation 09/24/23    PT Start Time 1445    PT Stop Time 1530    PT Time Calculation (min) 45 min            Past Medical History:  Diagnosis Date   Cellulitis of arm, left 08/06/2014   Chronic bronchitis with emphysema (HCC) 10/2019   CT chest:  Lung RADS 2.  Benign appearance.  Moderate centrilobular emphysema with diffuse bronchial thickening (chronic bronchitis).   Coronary artery calcification seen on CAT scan 10/2019   Chest CT 11/26/2019:  Three-vessel coronary atherosclerosis and aortic atherosclerosis; 03/07/2020: Coronary Calcium  Score 250.   Depression    Erectile dysfunction    History of kidney stones    30 years ago   Hyperlipemia    Recently started on statin   Rosacea    Sleep apnea    Thoracic aorta atherosclerosis (HCC)    Seen on CT scan   Vitamin D deficiency    Past Surgical History:  Procedure Laterality Date   APPENDECTOMY  1983   HERNIA REPAIR Left 2000   inguinal   INCISION AND DRAINAGE ABSCESS     KNEE ARTHROSCOPY Left    Medial meniscus   LUMBAR LAMINECTOMY/DECOMPRESSION MICRODISCECTOMY Right 05/30/2018   Procedure: Right Lumbar Three-Four Laminectomy for synovial cyst;  Surgeon: Onetha Kuba, MD;  Location: [facility] OR;  Service: Neurosurgery;  Laterality: Right;  posterior   SHOULDER SURGERY Bilateral    Clavicle surgeries   Patient Active Problem List   Diagnosis Date Noted   Lumbar radiculopathy 12/12/2022   Elevated blood pressure reading without diagnosis of hypertension 04/24/2020   Coronary artery calcification seen on CAT scan 02/22/2020   Synovial cyst 05/30/2018   OSA (obstructive sleep apnea) 04/06/2016   ED (erectile dysfunction) 04/20/2015   Muscle cramps 04/20/2015   Cellulitis of arm  08/06/2014   COPD, mild (HCC) 08/06/2014   Depression 08/06/2014   Hyperlipemia, mixed 08/06/2014    PCP: Ryan Hives   REFERRING PROVIDER: Joane Birmingham  REFERRING DIAG: Chronic neck pain and shoulder  THERAPY DIAG:  Cervicalgia  Other muscle spasm  Cervical radiculopathy  Rationale for Evaluation and Treatment: Rehabilitation  ONSET DATE: a couple of months now  SUBJECTIVE:  SUBJECTIVE STATEMENT: A little tired after last session. Neck feels about the same, shoulders feel a little better.  EVAL--The neck and shoulder are hurting for a couple of months now. It just started randomly and got worse. I don't recall doing anything to it. I do play golf at least once a week.    PERTINENT HISTORY:  Bilateral clavicle sx, lumbar laminectomy/decompression 2020  PAIN:  Are you having pain? Yes: NPRS scale: 7/10 Pain location: L upper trap, L side of neck Pain description: a little bit of tingling, dull, ache Aggravating factors: rotating it Relieving factors: tylenol  and advil   PRECAUTIONS: None  RED FLAGS: None     WEIGHT BEARING RESTRICTIONS: No  FALLS:  Has patient fallen in last 6 months? No  LIVING ENVIRONMENT: Lives with: lives with their family Lives in: House/apartment  OCCUPATION: retired  PLOF: Independent  PATIENT GOALS: to get rid of my pain  OBJECTIVE:  Note: Objective measures were completed at Evaluation unless otherwise noted.  DIAGNOSTIC FINDINGS:  Moderate disc space narrowing at C5-C6 and severe disc space narrowing at C6-C7. Normal alignment at the cervicothoracic junction. Prevertebral soft tissues are normal. Anterior osteophytes in the cervical spine. Bilateral facet arthropathy most prominent on the right side at C3-C4 C4-C5  1.  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 2. No acute bone abnormality.   COGNITION: Overall cognitive status: Within functional limits for tasks assessed  SENSATION: WFL  POSTURE: rounded shoulders  PALPATION: Tightness in bilateral upper traps, some radiating sx into L arm when pushing on trigger points    CERVICAL ROM:   Active ROM A/PROM (deg) eval  Flexion WFL with pain on L side  Extension 50% with catch in neck  Right lateral flexion 25% with pain  Left lateral flexion 20% with pain  Right rotation 75%  Left rotation 50% with pain   (Blank rows = not tested)  UPPER EXTREMITY ROM: WFL   UPPER EXTREMITY MMT: 5/5 BUE  TREATMENT DATE: 08/05/23 UBE L3 x55mins each way  Seated row 25# 2x10 Lat pull down 25# 2x10 Shoulder ext 10# 2x10  Chin tuck 2x10 W backs x10 Horizontal abd red band 2x10 Pec stretch 15s x2 STM to neck and rhomboids, passive stretching of neck  07/31/23 UBE L2 x12mins each way  Scapular retraction red band 2x10 UT stretch holding 7# weight 30s each side  Shoulder shrugs 5# 2x10  Shoulder rolls 5# 2x10 STM to neck and rhomboids, use of theragun Cervical traction with MH 12# x68mins  07/30/23 EVAL                                                                                                                                PATIENT EDUCATION:  Education details: POC, HEP, traction Person educated: Patient Education method: Medical illustrator Education comprehension: verbalized understanding and returned demonstration  HOME EXERCISE PROGRAM: Access Code: [email or web address] URL: [email or web address] Date: 07/30/2023 Prepared  by: Almetta Fam  Exercises - Seated Levator Scapulae Stretch  - 1 x daily - 7 x weekly - 2 reps - 15 hold - Seated Upper Trapezius Stretch  - 1 x daily - 7 x weekly - 2 reps - 15 hold - Median Nerve Flossing - Tray  - 1 x daily - 7 x weekly - 2 sets - 10 reps - Seated  Assisted Cervical Rotation with Towel  - 1 x daily - 7 x weekly - 2 sets - 10 reps - 3 hold - Seated Cervical Traction  - 1 x daily - 7 x weekly - 10 reps - 10 hold  ASSESSMENT:  CLINICAL IMPRESSION: Patient is a 73 y.o. male who was seen today for physical therapy treatment for chronic left lateral neck pain extending into the rhomboid region and trapezius with some pain radiating down the left arm. He has some numbness and tingling into his L hand. He may have cervical radiculopathy and or carpal tunnel syndrome. Reports that with the nerve glides, he can feel it but thinks it has lessened the N/T overall. His upper traps are over active and he needs cues to relax often. Cues needed with exercises for form. Limited in thoracic mobility as noted with W backs.   Patient reports massage gun was very helpful and relaxing. He does have some larger trigger points in his upper traps that radiate when pushed on. Can try traction again next visit. Patient will benefit from skilled PT to address his neck pain and tightness to improve QOL and allow him to exercise and play golf normally.    OBJECTIVE IMPAIRMENTS: decreased ROM, increased fascial restrictions, increased muscle spasms, impaired flexibility, and pain.   ACTIVITY LIMITATIONS: carrying and lifting  PARTICIPATION LIMITATIONS: yard work and golf and exercising at [employer]  PERSONAL FACTORS: Time since onset of injury/illness/exacerbation are also affecting patient's functional outcome.   REHAB POTENTIAL: Good  CLINICAL DECISION MAKING: Stable/uncomplicated  EVALUATION COMPLEXITY: Low   GOALS: Goals reviewed with patient? Yes  SHORT TERM GOALS: Target date: 08/27/23  Patient will be independent with initial HEP.  Baseline:  Goal status: INITIAL   LONG TERM GOALS: Target date: 09/24/23  Patient will be independent with advanced/ongoing HEP to improve outcomes and carryover.  Baseline:  Goal status: INITIAL  2.  Patient will report  75% improvement in neck pain to improve QOL.  Baseline: 7/10 Goal status: INITIAL  3.  Patient will demonstrate full pain free cervical ROM. Baseline: see chart Goal status: INITIAL  4.  Patient will return to playing golf and doing regular exercise at [facility] without pain  Baseline: slowed down some at gym for now Goal status: INITIAL  PLAN:  PT FREQUENCY: 2x/week  PT DURATION: 8 weeks  PLANNED INTERVENTIONS: 97110-Therapeutic exercises, 97530- Therapeutic activity, 97112- Neuromuscular re-education, 97535- Self Care, 02859- Manual therapy, G0283- Electrical stimulation (unattended), 97012- Traction (mechanical), 20560 (1-2 muscles), 20561 (3+ muscles)- Dry Needling, Patient/Family education, Taping, Joint mobilization, Joint manipulation, Spinal manipulation, Spinal mobilization, Cryotherapy, and Moist heat  PLAN FOR NEXT SESSION: how was traction? STM to neck and rhomboids, DN if he wants, postural strengthening    Almetta Fam, PT 08/05/2023, 3:29 PM

## 2023-08-05 NOTE — Progress Notes (Signed)
Cervical spine x-ray shows some arthritis.

## 2023-08-08 ENCOUNTER — Encounter: Payer: Self-pay | Admitting: Physical Therapy

## 2023-08-08 ENCOUNTER — Ambulatory Visit: Admitting: Physical Therapy

## 2023-08-08 DIAGNOSIS — M62838 Other muscle spasm: Secondary | ICD-10-CM

## 2023-08-08 DIAGNOSIS — M542 Cervicalgia: Secondary | ICD-10-CM | POA: Diagnosis not present

## 2023-08-08 DIAGNOSIS — M5412 Radiculopathy, cervical region: Secondary | ICD-10-CM

## 2023-08-08 NOTE — Therapy (Signed)
 OUTPATIENT PHYSICAL THERAPY CERVICAL TREATMENT   Patient Name: Patrick Craig MRN: [id number] DOB:10-Mar-1950, 73 y.o., male Today's Date: 08/08/2023  END OF SESSION:  PT End of Session - 08/08/23 0804     Visit Number 4    Date for PT Re-Evaluation 09/24/23    PT Start Time 0804    PT Stop Time 0930    PT Time Calculation (min) 86 min    Activity Tolerance Patient tolerated treatment well    Behavior During Therapy [facility] for tasks assessed/performed            Past Medical History:  Diagnosis Date   Cellulitis of arm, left 08/06/2014   Chronic bronchitis with emphysema (HCC) 10/2019   CT chest:  Lung RADS 2.  Benign appearance.  Moderate centrilobular emphysema with diffuse bronchial thickening (chronic bronchitis).   Coronary artery calcification seen on CAT scan 10/2019   Chest CT 11/26/2019:  Three-vessel coronary atherosclerosis and aortic atherosclerosis; 03/07/2020: Coronary Calcium  Score 250.   Depression    Erectile dysfunction    History of kidney stones    30 years ago   Hyperlipemia    Recently started on statin   Rosacea    Sleep apnea    Thoracic aorta atherosclerosis (HCC)    Seen on CT scan   Vitamin D deficiency    Past Surgical History:  Procedure Laterality Date   APPENDECTOMY  1983   HERNIA REPAIR Left 2000   inguinal   INCISION AND DRAINAGE ABSCESS     KNEE ARTHROSCOPY Left    Medial meniscus   LUMBAR LAMINECTOMY/DECOMPRESSION MICRODISCECTOMY Right 05/30/2018   Procedure: Right Lumbar Three-Four Laminectomy for synovial cyst;  Surgeon: Onetha Kuba, MD;  Location: [facility] OR;  Service: Neurosurgery;  Laterality: Right;  posterior   SHOULDER SURGERY Bilateral    Clavicle surgeries   Patient Active Problem List   Diagnosis Date Noted   Lumbar radiculopathy 12/12/2022   Elevated blood pressure reading without diagnosis of hypertension 04/24/2020   Coronary artery calcification seen on CAT scan 02/22/2020   Synovial cyst 05/30/2018   OSA  (obstructive sleep apnea) 04/06/2016   ED (erectile dysfunction) 04/20/2015   Muscle cramps 04/20/2015   Cellulitis of arm 08/06/2014   COPD, mild (HCC) 08/06/2014   Depression 08/06/2014   Hyperlipemia, mixed 08/06/2014    PCP: Ryan Hives   REFERRING PROVIDER: Joane Birmingham  REFERRING DIAG: Chronic neck pain and shoulder  THERAPY DIAG:  Cervicalgia  Other muscle spasm  Cervical radiculopathy  Rationale for Evaluation and Treatment: Rehabilitation  ONSET DATE: a couple of months now  SUBJECTIVE:  SUBJECTIVE STATEMENT: Pretty good, pan mostly in the neck  EVAL--The neck and shoulder are hurting for a couple of months now. It just started randomly and got worse. I don't recall doing anything to it. I do play golf at least once a week.    PERTINENT HISTORY:  Bilateral clavicle sx, lumbar laminectomy/decompression 2020  PAIN:  Are you having pain? Yes: NPRS scale: 4/10 Pain location: L upper trap, L side of neck Pain description: a little bit of tingling, dull, ache Aggravating factors: rotating it Relieving factors: tylenol  and advil   PRECAUTIONS: None  RED FLAGS: None     WEIGHT BEARING RESTRICTIONS: No  FALLS:  Has patient fallen in last 6 months? No  LIVING ENVIRONMENT: Lives with: lives with their family Lives in: House/apartment  OCCUPATION: retired  PLOF: Independent  PATIENT GOALS: to get rid of my pain  OBJECTIVE:  Note: Objective measures were completed at Evaluation unless otherwise noted.  DIAGNOSTIC FINDINGS:  Moderate disc space narrowing at C5-C6 and severe disc space narrowing at C6-C7. Normal alignment at the cervicothoracic junction. Prevertebral soft tissues are normal. Anterior osteophytes in the cervical spine. Bilateral facet  arthropathy most prominent on the right side at C3-C4 C4-C5  1. 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 2. No acute bone abnormality.   COGNITION: Overall cognitive status: Within functional limits for tasks assessed  SENSATION: WFL  POSTURE: rounded shoulders  PALPATION: Tightness in bilateral upper traps, some radiating sx into L arm when pushing on trigger points    CERVICAL ROM:   Active ROM A/PROM (deg) eval 08/08/23  Flexion WFL with pain on L side WFL  Extension 50% with catch in neck Limited 25%  Right lateral flexion 25% with pain Limited 50%  Left lateral flexion 20% with pain Limited 25%  Right rotation 75% Limited 25%  Left rotation 50% with pain Limited 25%   (Blank rows = not tested)  UPPER EXTREMITY ROM: WFL   UPPER EXTREMITY MMT: 5/5 BUE  TREATMENT DATE: 08/08/23 UBE L3 x 3 min each STM to UT and cervical spine  passive stretching of neck  STM w/ Thera gun Rows & Lats 35lb 2x10 Shoulder ER green 2x10 Horiz abd green 2x10 Pec stretch 15s x2  08/05/23 UBE L3 x40mins each way  Seated row 25# 2x10 Lat pull down 25# 2x10 Shoulder ext 10# 2x10  Chin tuck 2x10 W backs x10 Horizontal abd red band 2x10 Pec stretch 15s x2 STM to neck and rhomboids, passive stretching of neck  07/31/23 UBE L2 x87mins each way  Scapular retraction red band 2x10 UT stretch holding 7# weight 30s each side  Shoulder shrugs 5# 2x10  Shoulder rolls 5# 2x10 STM to neck and rhomboids, use of theragun Cervical traction with MH 12# x31mins  07/30/23 EVAL  PATIENT EDUCATION:  Education details: POC, HEP, traction Person educated: Patient Education method: Medical illustrator Education comprehension: verbalized understanding and returned demonstration  HOME EXERCISE PROGRAM: Access Code: [email or web address] URL:  [email or web address] Date: 07/30/2023 Prepared by: Almetta Fam  Exercises - Seated Levator Scapulae Stretch  - 1 x daily - 7 x weekly - 2 reps - 15 hold - Seated Upper Trapezius Stretch  - 1 x daily - 7 x weekly - 2 reps - 15 hold - Median Nerve Flossing - Tray  - 1 x daily - 7 x weekly - 2 sets - 10 reps - Seated Assisted Cervical Rotation with Towel  - 1 x daily - 7 x weekly - 2 sets - 10 reps - 3 hold - Seated Cervical Traction  - 1 x daily - 7 x weekly - 10 reps - 10 hold  ASSESSMENT:  CLINICAL IMPRESSION: Patient is a 73 y.o. male who was seen today for physical therapy treatment for chronic left lateral neck pain extending into the rhomboid region and trapezius with some pain radiating down the left arm. He has some numbness and tingling into his L hand. He may have cervical radiculopathy and or carpal tunnel syndrome. His upper traps are over active and he needs cues to relax often. Cues needed with exercises for form. Some progress have been made increasing her cervical ROM.  Patient reports massage gun was very helpful and relaxing. He does have some larger trigger points in his upper traps that responded well with STM. Pt repots having a at home cervical traction machine. Patient will benefit from skilled PT to address his neck pain and tightness to improve QOL and allow him to exercise and play golf normally.    OBJECTIVE IMPAIRMENTS: decreased ROM, increased fascial restrictions, increased muscle spasms, impaired flexibility, and pain.   ACTIVITY LIMITATIONS: carrying and lifting  PARTICIPATION LIMITATIONS: yard work and golf and exercising at [employer]  PERSONAL FACTORS: Time since onset of injury/illness/exacerbation are also affecting patient's functional outcome.   REHAB POTENTIAL: Good  CLINICAL DECISION MAKING: Stable/uncomplicated  EVALUATION COMPLEXITY: Low   GOALS: Goals reviewed with patient? Yes  SHORT TERM GOALS: Target date: 08/27/23  Patient  will be independent with initial HEP.  Baseline:  Goal status: INITIAL   LONG TERM GOALS: Target date: 09/24/23  Patient will be independent with advanced/ongoing HEP to improve outcomes and carryover.  Baseline:  Goal status: INITIAL  2.  Patient will report 75% improvement in neck pain to improve QOL.  Baseline: 7/10 Goal status: Progressing 08/08/23  3.  Patient will demonstrate full pain free cervical ROM. Baseline: see chart Goal status: Progressing 08/08/23  4.  Patient will return to playing golf and doing regular exercise at [facility] without pain  Baseline: slowed down some at gym for now Goal status: INITIAL  PLAN:  PT FREQUENCY: 2x/week  PT DURATION: 8 weeks  PLANNED INTERVENTIONS: 97110-Therapeutic exercises, 97530- Therapeutic activity, 97112- Neuromuscular re-education, 97535- Self Care, 02859- Manual therapy, G0283- Electrical stimulation (unattended), 97012- Traction (mechanical), 20560 (1-2 muscles), 20561 (3+ muscles)- Dry Needling, Patient/Family education, Taping, Joint mobilization, Joint manipulation, Spinal manipulation, Spinal mobilization, Cryotherapy, and Moist heat  PLAN FOR NEXT SESSION: how was traction? STM to neck and rhomboids, DN if he wants, postural strengthening    Tanda KANDICE Sorrow, PTA 08/08/2023, 8:04 AM

## 2023-08-12 ENCOUNTER — Ambulatory Visit: Admitting: Physical Therapy

## 2023-08-14 DIAGNOSIS — Z1211 Encounter for screening for malignant neoplasm of colon: Secondary | ICD-10-CM | POA: Diagnosis not present

## 2023-08-20 DIAGNOSIS — Z6830 Body mass index (BMI) 30.0-30.9, adult: Secondary | ICD-10-CM | POA: Diagnosis not present

## 2023-08-20 DIAGNOSIS — J069 Acute upper respiratory infection, unspecified: Secondary | ICD-10-CM | POA: Diagnosis not present

## 2023-08-20 DIAGNOSIS — R6883 Chills (without fever): Secondary | ICD-10-CM | POA: Diagnosis not present

## 2023-08-21 ENCOUNTER — Ambulatory Visit

## 2023-08-23 ENCOUNTER — Telehealth: Payer: Self-pay | Admitting: Family Medicine

## 2023-08-23 ENCOUNTER — Ambulatory Visit: Admitting: Physical Therapy

## 2023-08-23 ENCOUNTER — Encounter: Payer: Self-pay | Admitting: Physical Therapy

## 2023-08-23 DIAGNOSIS — M542 Cervicalgia: Secondary | ICD-10-CM

## 2023-08-23 DIAGNOSIS — M62838 Other muscle spasm: Secondary | ICD-10-CM

## 2023-08-23 DIAGNOSIS — M5412 Radiculopathy, cervical region: Secondary | ICD-10-CM

## 2023-08-23 MED ORDER — HYDROCODONE-ACETAMINOPHEN 5-325 MG PO TABS: 1.0000 | ORAL_TABLET | Freq: Four times a day (QID) | ORAL | 0 refills | Status: AC | PRN

## 2023-08-23 NOTE — Telephone Encounter (Signed)
 Left message informing patient.

## 2023-08-23 NOTE — Therapy (Signed)
 OUTPATIENT PHYSICAL THERAPY CERVICAL TREATMENT   Patient Name: Patrick Craig MRN: [id number] DOB:10-14-1950, 73 y.o., male Today's Date: 08/23/2023  END OF SESSION:  PT End of Session - 08/23/23 1015     Visit Number 5    Date for PT Re-Evaluation 09/24/23    PT Start Time 1013    PT Stop Time 1100    PT Time Calculation (min) 47 min    Activity Tolerance Patient tolerated treatment well    Behavior During Therapy [facility] for tasks assessed/performed            Past Medical History:  Diagnosis Date   Cellulitis of arm, left 08/06/2014   Chronic bronchitis with emphysema (HCC) 10/2019   CT chest:  Lung RADS 2.  Benign appearance.  Moderate centrilobular emphysema with diffuse bronchial thickening (chronic bronchitis).   Coronary artery calcification seen on CAT scan 10/2019   Chest CT 11/26/2019:  Three-vessel coronary atherosclerosis and aortic atherosclerosis; 03/07/2020: Coronary Calcium  Score 250.   Depression    Erectile dysfunction    History of kidney stones    30 years ago   Hyperlipemia    Recently started on statin   Rosacea    Sleep apnea    Thoracic aorta atherosclerosis (HCC)    Seen on CT scan   Vitamin D deficiency    Past Surgical History:  Procedure Laterality Date   APPENDECTOMY  1983   HERNIA REPAIR Left 2000   inguinal   INCISION AND DRAINAGE ABSCESS     KNEE ARTHROSCOPY Left    Medial meniscus   LUMBAR LAMINECTOMY/DECOMPRESSION MICRODISCECTOMY Right 05/30/2018   Procedure: Right Lumbar Three-Four Laminectomy for synovial cyst;  Surgeon: Onetha Kuba, MD;  Location: [facility] OR;  Service: Neurosurgery;  Laterality: Right;  posterior   SHOULDER SURGERY Bilateral    Clavicle surgeries   Patient Active Problem List   Diagnosis Date Noted   Lumbar radiculopathy 12/12/2022   Elevated blood pressure reading without diagnosis of hypertension 04/24/2020   Coronary artery calcification seen on CAT scan 02/22/2020   Synovial cyst 05/30/2018   OSA  (obstructive sleep apnea) 04/06/2016   ED (erectile dysfunction) 04/20/2015   Muscle cramps 04/20/2015   Cellulitis of arm 08/06/2014   COPD, mild (HCC) 08/06/2014   Depression 08/06/2014   Hyperlipemia, mixed 08/06/2014    PCP: Ryan Hives   REFERRING PROVIDER: Joane Birmingham  REFERRING DIAG: Chronic neck pain and shoulder  THERAPY DIAG:  Cervicalgia  Other muscle spasm  Cervical radiculopathy  Rationale for Evaluation and Treatment: Rehabilitation  ONSET DATE: a couple of months now  SUBJECTIVE:  SUBJECTIVE STATEMENT: Patient comes in reporting left neck pain an 8/10, reports that he played golf on Wednesday and grounded his club, reports some significant pain since then, really hurting  EVAL--The neck and shoulder are hurting for a couple of months now. It just started randomly and got worse. I don't recall doing anything to it. I do play golf at least once a week.    PERTINENT HISTORY:  Bilateral clavicle sx, lumbar laminectomy/decompression 2020  PAIN:  Are you having pain? Yes: NPRS scale: 4/10 Pain location: L upper trap, L side of neck Pain description: a little bit of tingling, dull, ache Aggravating factors: rotating it Relieving factors: tylenol  and advil   PRECAUTIONS: None  RED FLAGS: None     WEIGHT BEARING RESTRICTIONS: No  FALLS:  Has patient fallen in last 6 months? No  LIVING ENVIRONMENT: Lives with: lives with their family Lives in: House/apartment  OCCUPATION: retired  PLOF: Independent  PATIENT GOALS: to get rid of my pain  OBJECTIVE:  Note: Objective measures were completed at Evaluation unless otherwise noted.  DIAGNOSTIC FINDINGS:  Moderate disc space narrowing at C5-C6 and severe disc space narrowing at C6-C7. Normal alignment  at the cervicothoracic junction. Prevertebral soft tissues are normal. Anterior osteophytes in the cervical spine. Bilateral facet arthropathy most prominent on the right side at C3-C4 C4-C5  1. 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 2. No acute bone abnormality.   COGNITION: Overall cognitive status: Within functional limits for tasks assessed  SENSATION: WFL  POSTURE: rounded shoulders  PALPATION: Tightness in bilateral upper traps, some radiating sx into L arm when pushing on trigger points    CERVICAL ROM:   Active ROM A/PROM (deg) eval 08/08/23  Flexion WFL with pain on L side WFL  Extension 50% with catch in neck Limited 25%  Right lateral flexion 25% with pain Limited 50%  Left lateral flexion 20% with pain Limited 25%  Right rotation 75% Limited 25%  Left rotation 50% with pain Limited 25%   (Blank rows = not tested)  UPPER EXTREMITY ROM: WFL   UPPER EXTREMITY MMT: 5/5 BUE  TREATMENT DATE: 08/23/23 STM with tgun and hands left upper trap, rhomboid, and cervical area Supine occipital release Gentle cervical distraction Gentle PROM of the cervical spine Contract relax of side bending and rotation Manual shoulder depression Ice/IFC to the left neck upper trap  08/08/23 UBE L3 x 3 min each STM to UT and cervical spine  passive stretching of neck  STM w/ Thera gun Rows & Lats 35lb 2x10 Shoulder ER green 2x10 Horiz abd green 2x10 Pec stretch 15s x2  08/05/23 UBE L3 x4mins each way  Seated row 25# 2x10 Lat pull down 25# 2x10 Shoulder ext 10# 2x10  Chin tuck 2x10 W backs x10 Horizontal abd red band 2x10 Pec stretch 15s x2 STM to neck and rhomboids, passive stretching of neck  07/31/23 UBE L2 x31mins each way  Scapular retraction red band 2x10 UT stretch holding 7# weight 30s each side  Shoulder shrugs 5# 2x10  Shoulder rolls 5# 2x10 STM to neck and rhomboids, use of theragun Cervical traction with MH 12#  x62mins  07/30/23 EVAL  PATIENT EDUCATION:  Education details: POC, HEP, traction Person educated: Patient Education method: Medical illustrator Education comprehension: verbalized understanding and returned demonstration  HOME EXERCISE PROGRAM: Access Code: [email or web address] URL: [email or web address] Date: 07/30/2023 Prepared by: Almetta Fam  Exercises - Seated Levator Scapulae Stretch  - 1 x daily - 7 x weekly - 2 reps - 15 hold - Seated Upper Trapezius Stretch  - 1 x daily - 7 x weekly - 2 reps - 15 hold - Median Nerve Flossing - Tray  - 1 x daily - 7 x weekly - 2 sets - 10 reps - Seated Assisted Cervical Rotation with Towel  - 1 x daily - 7 x weekly - 2 sets - 10 reps - 3 hold - Seated Cervical Traction  - 1 x daily - 7 x weekly - 10 reps - 10 hold  ASSESSMENT:  CLINICAL IMPRESSION: Patient is a 73 y.o. male who was seen today for physical therapy treatment for chronic left lateral neck pain extending into the rhomboid region and trapezius with some pain radiating down the left arm. He has not been to PT in two weeks, and reports that on Wednesday he was playing golf and grounded his club and has had significant increase in pain since that time, he is very tender and has a loss of cervical ROM. He was very tender and tended to jump at times with motions and touch, seems to possibly be a facet issue that is getting caught causing the sharp shooting pain.  His upper traps are over active and he needs cues to relax often.   Patient reports massage gun was very helpful and relaxing. He does have some larger trigger points in his upper traps that responded well with STM. Pt repots having a at home cervical traction machine. Patient will benefit from skilled PT to address his neck pain and tightness to improve QOL and allow him to exercise and play  golf normally.    OBJECTIVE IMPAIRMENTS: decreased ROM, increased fascial restrictions, increased muscle spasms, impaired flexibility, and pain.   ACTIVITY LIMITATIONS: carrying and lifting  PARTICIPATION LIMITATIONS: yard work and golf and exercising at [employer]  PERSONAL FACTORS: Time since onset of injury/illness/exacerbation are also affecting patient's functional outcome.   REHAB POTENTIAL: Good  CLINICAL DECISION MAKING: Stable/uncomplicated  EVALUATION COMPLEXITY: Low   GOALS: Goals reviewed with patient? Yes  SHORT TERM GOALS: Target date: 08/27/23  Patient will be independent with initial HEP.  Baseline:  Goal status: met 08/23/23   LONG TERM GOALS: Target date: 09/24/23  Patient will be independent with advanced/ongoing HEP to improve outcomes and carryover.  Baseline:  Goal status: INITIAL  2.  Patient will report 75% improvement in neck pain to improve QOL.  Baseline: 7/10 Goal status: Progressing 08/08/23  3.  Patient will demonstrate full pain free cervical ROM. Baseline: see chart Goal status: Progressing 08/08/23  4.  Patient will return to playing golf and doing regular exercise at [facility] without pain  Baseline: slowed down some at gym for now Goal status: INITIAL  PLAN:  PT FREQUENCY: 2x/week  PT DURATION: 8 weeks  PLANNED INTERVENTIONS: 97110-Therapeutic exercises, 97530- Therapeutic activity, 97112- Neuromuscular re-education, 97535- Self Care, 02859- Manual therapy, G0283- Electrical stimulation (unattended), 97012- Traction (mechanical), 20560 (1-2 muscles), 20561 (3+ muscles)- Dry Needling, Patient/Family education, Taping, Joint mobilization, Joint manipulation, Spinal manipulation, Spinal mobilization, Cryotherapy, and Moist heat  PLAN FOR NEXT SESSION: Patient with increased pain and a loss of motion, advised to se MD due to  his increased pain after grounding club while golfing on Wednesday, very sore today and frustrated.   OBADIAH OZELL ORN,  PT 08/23/2023, 10:16 AM

## 2023-08-23 NOTE — Telephone Encounter (Signed)
 Hydrocodone prescribed for pain control temporarily until he can get an appointment.  Heating pad can be helpful as well.

## 2023-08-23 NOTE — Telephone Encounter (Signed)
 Patient called stating that he is having continued/worsening neck pain. He has been going to PT which seems like it was helping but after playing golf yesterday, the pain is 10 times worse. Patient is scheduled to see Dr Joane on Wednesday (the earliest he could come due to a trip) and asked if there was anything Dr Joane might would recommend to help until he is able to get in.

## 2023-08-28 ENCOUNTER — Ambulatory Visit: Admitting: Family Medicine

## 2023-08-28 ENCOUNTER — Ambulatory Visit

## 2023-08-28 VITALS — BP 154/94 | HR 50 | Ht 73.0 in | Wt 221.0 lb

## 2023-08-28 DIAGNOSIS — G5602 Carpal tunnel syndrome, left upper limb: Secondary | ICD-10-CM | POA: Diagnosis not present

## 2023-08-28 DIAGNOSIS — M542 Cervicalgia: Secondary | ICD-10-CM | POA: Diagnosis not present

## 2023-08-28 DIAGNOSIS — G8929 Other chronic pain: Secondary | ICD-10-CM

## 2023-08-28 NOTE — Patient Instructions (Addendum)
 Thank you for coming in today.   Continue PT and consider dry needling.  If not better, let us  know, and we will order a MRI  Reminder: Dr. Joane will be out of the office starting August 1st, for about 6 weeks.  TENS UNIT: This is helpful for muscle pain and spasm.   Search and Purchase a TENS 7000 2nd edition at  www.tenspros.com or www.Amazon.com It should be less than $30.     TENS unit instructions: Do not shower or bathe with the unit on Turn the unit off before removing electrodes or batteries If the electrodes lose stickiness add a drop of water to the electrodes after they are disconnected from the unit and place on plastic sheet. If you continued to have difficulty, call the TENS unit company to purchase more electrodes. Do not apply lotion on the skin area prior to use. Make sure the skin is clean and dry as this will help prolong the life of the electrodes. After use, always check skin for unusual red areas, rash or other skin difficulties. If there are any skin problems, does not apply electrodes to the same area. Never remove the electrodes from the unit by pulling the wires. Do not use the TENS unit or electrodes other than as directed. Do not change electrode placement without consultating your therapist or physician. Keep 2 fingers with between each electrode. Wear time ratio is 2:1, on to off times.   For example on for 30 minutes off for 15 minutes and then on for 30 minutes off for 15 minutes

## 2023-08-28 NOTE — Progress Notes (Signed)
   LILLETTE Ileana Collet, PhD, LAT, ATC acting as a scribe for Artist Lloyd, MD.  Patrick Craig is a 73 y.o. male who presents to [facility] today for worsening neck pain. Pt was last seen by Dr. Lloyd on 07/29/23 and was referred to PT, completing 5 visits.  Pt called the office on 7/25 c/o worsening neck pain after playing golf the day prior. He was prescribed hydrocodone  and advised to use a heating pad.  Today, pt reports he hit the ground on a golf swing that significantly exacerbated his neck pain. Pt locates pain to the L-side of his neck, close to the base of his skull. Pain is very localized. He notes intermittent tingling into his L hand. He notes mechanical symptoms w/ lateral neck flexion.  Dx imaging: 07/29/23 C-spine XR  Pertinent review of systems: No fevers or chills  Relevant historical information: COPD.  History of synovial cyst lumbar spine removed surgically by Dr. Onetha   Exam:  BP (!) 154/94   Pulse (!) 50   Ht 6' 1 (1.854 m)   Wt 221 lb (100.2 kg)   SpO2 98%   BMI 29.16 kg/m  General: Well Developed, well nourished, and in no acute distress.   MSK: C-Spine: Normal appearing Nontender palpation midline.  Tender palpation left cervical paraspinal musculature. Decreased cervical motion. Upper [place] strength is intact.  Positive Tinel's test carpal tunnel left worse than right.       Assessment and Plan: 73 y.o. male with exacerbation of chronic neck pain.  Pain due to muscle dysfunction and facet DJD and DDD.  Plan to continue PT and include dry needling.  If not improved in 2 to 4 weeks he will let us  know we can proceed to MRI cervical spine.  This would be for injection planning.  Would refer to Dr. Darlis at [facility] following MRI for injection options.  Hand paresthesias most likely due to carpal tunnel syndrome.  Recommend again carpal tunnel wrist brace.  If needed MRI should be helpful for evaluating  for cervical radiculopathy as well.   PDMP not reviewed this encounter. No orders of the defined types were placed in this encounter.  No orders of the defined types were placed in this encounter.    Discussed warning signs or symptoms. Please see discharge instructions. Patient expresses understanding.   The above documentation has been reviewed and is accurate and complete Artist Lloyd, M.D.

## 2023-08-29 NOTE — Therapy (Signed)
 OUTPATIENT PHYSICAL THERAPY CERVICAL TREATMENT   Patient Name: Patrick Craig MRN: [id number] DOB:[birth year], 73 y.o., male Today's Date: 08/30/2023  END OF SESSION:  PT End of Session - 08/30/23 1019     Visit Number 6    Date for PT Re-Evaluation 09/24/23    PT Start Time 1019    PT Stop Time 1100    PT Time Calculation (min) 41 min    Activity Tolerance Patient tolerated treatment well    Behavior During Therapy [facility] for tasks assessed/performed             Past Medical History:  Diagnosis Date   Cellulitis of arm, left 08/06/2014   Chronic bronchitis with emphysema (HCC) 10/2019   CT chest:  Lung RADS 2.  Benign appearance.  Moderate centrilobular emphysema with diffuse bronchial thickening (chronic bronchitis).   Coronary artery calcification seen on CAT scan 10/2019   Chest CT 11/26/2019:  Three-vessel coronary atherosclerosis and aortic atherosclerosis; 03/07/2020: Coronary Calcium  Score 250.   Depression    Erectile dysfunction    History of kidney stones    30 years ago   Hyperlipemia    Recently started on statin   Rosacea    Sleep apnea    Thoracic aorta atherosclerosis (HCC)    Seen on CT scan   Vitamin D deficiency    Past Surgical History:  Procedure Laterality Date   APPENDECTOMY  1983   HERNIA REPAIR Left 2000   inguinal   INCISION AND DRAINAGE ABSCESS     KNEE ARTHROSCOPY Left    Medial meniscus   LUMBAR LAMINECTOMY/DECOMPRESSION MICRODISCECTOMY Right 05/30/2018   Procedure: Right Lumbar Three-Four Laminectomy for synovial cyst;  Surgeon: Onetha Kuba, MD;  Location: [facility] OR;  Service: Neurosurgery;  Laterality: Right;  posterior   SHOULDER SURGERY Bilateral    Clavicle surgeries   Patient Active Problem List   Diagnosis Date Noted   Lumbar radiculopathy 12/12/2022   Elevated blood pressure reading without diagnosis of hypertension 04/24/2020   Coronary artery calcification seen on CAT scan 02/22/2020   Synovial cyst 05/30/2018   OSA  (obstructive sleep apnea) 04/06/2016   ED (erectile dysfunction) 04/20/2015   Muscle cramps 04/20/2015   Cellulitis of arm 08/06/2014   COPD, mild (HCC) 08/06/2014   Depression 08/06/2014   Hyperlipemia, mixed 08/06/2014    PCP: Ryan Hives   REFERRING PROVIDER: Joane Birmingham  REFERRING DIAG: Chronic neck pain and shoulder  THERAPY DIAG:  Cervicalgia  Other muscle spasm  Cervical radiculopathy  Rationale for Evaluation and Treatment: Rehabilitation  ONSET DATE: a couple of months now  SUBJECTIVE:  SUBJECTIVE STATEMENT: Not as bad since I saw Garrel. The shoulder may be a little better from doing the exercises. The neck is still killing me.   EVAL--The neck and shoulder are hurting for a couple of months now. It just started randomly and got worse. I don't recall doing anything to it. I do play golf at least once a week.    PERTINENT HISTORY:  Bilateral clavicle sx, lumbar laminectomy/decompression 2020  PAIN:  Are you having pain? Yes: NPRS scale: 4/10 Pain location: L upper trap, L side of neck Pain description: a little bit of tingling, dull, ache Aggravating factors: rotating it Relieving factors: tylenol  and advil   PRECAUTIONS: None  RED FLAGS: None     WEIGHT BEARING RESTRICTIONS: No  FALLS:  Has patient fallen in last 6 months? No  LIVING ENVIRONMENT: Lives with: lives with their family Lives in: House/apartment  OCCUPATION: retired  PLOF: Independent  PATIENT GOALS: to get rid of my pain  OBJECTIVE:  Note: Objective measures were completed at Evaluation unless otherwise noted.  DIAGNOSTIC FINDINGS:  Moderate disc space narrowing at C5-C6 and severe disc space narrowing at C6-C7. Normal alignment at the cervicothoracic junction. Prevertebral soft  tissues are normal. Anterior osteophytes in the cervical spine. Bilateral facet arthropathy most prominent on the right side at C3-C4 C4-C5  1. 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 2. No acute bone abnormality.   COGNITION: Overall cognitive status: Within functional limits for tasks assessed  SENSATION: WFL  POSTURE: rounded shoulders  PALPATION: Tightness in bilateral upper traps, some radiating sx into L arm when pushing on trigger points    CERVICAL ROM:   Active ROM A/PROM (deg) eval 08/08/23  Flexion WFL with pain on L side WFL  Extension 50% with catch in neck Limited 25%  Right lateral flexion 25% with pain Limited 50%  Left lateral flexion 20% with pain Limited 25%  Right rotation 75% Limited 25%  Left rotation 50% with pain Limited 25%   (Blank rows = not tested)  UPPER EXTREMITY ROM: WFL   UPPER EXTREMITY MMT: 5/5 BUE  TREATMENT DATE: 08/30/23 UBE L3 x70mins each way  Pec stretch in doorway  Seated row 35# 2x12 Lat pull down 35# 2x12 3 way reach with band x10 STM and passive ROM- some use of theragun    08/23/23 STM with tgun and hands left upper trap, rhomboid, and cervical area Supine occipital release Gentle cervical distraction Gentle PROM of the cervical spine Contract relax of side bending and rotation Manual shoulder depression Ice/IFC to the left neck upper trap  08/08/23 UBE L3 x 3 min each STM to UT and cervical spine  passive stretching of neck  STM w/ Thera gun Rows & Lats 35lb 2x10 Shoulder ER green 2x10 Horiz abd green 2x10 Pec stretch 15s x2  08/05/23 UBE L3 x72mins each way  Seated row 25# 2x10 Lat pull down 25# 2x10 Shoulder ext 10# 2x10  Chin tuck 2x10 W backs x10 Horizontal abd red band 2x10 Pec stretch 15s x2 STM to neck and rhomboids, passive stretching of neck  07/31/23 UBE L2 x21mins each way  Scapular retraction red band 2x10 UT stretch holding 7# weight 30s each side  Shoulder  shrugs 5# 2x10  Shoulder rolls 5# 2x10 STM to neck and rhomboids, use of theragun Cervical traction with MH 12# x39mins  07/30/23 EVAL  PATIENT EDUCATION:  Education details: POC, HEP, traction Person educated: Patient Education method: Medical illustrator Education comprehension: verbalized understanding and returned demonstration  HOME EXERCISE PROGRAM: Access Code: [email or web address] URL: [email or web address] Date: 07/30/2023 Prepared by: Almetta Fam  Exercises - Seated Levator Scapulae Stretch  - 1 x daily - 7 x weekly - 2 reps - 15 hold - Seated Upper Trapezius Stretch  - 1 x daily - 7 x weekly - 2 reps - 15 hold - Median Nerve Flossing - Tray  - 1 x daily - 7 x weekly - 2 sets - 10 reps - Seated Assisted Cervical Rotation with Towel  - 1 x daily - 7 x weekly - 2 sets - 10 reps - 3 hold - Seated Cervical Traction  - 1 x daily - 7 x weekly - 10 reps - 10 hold  ASSESSMENT:  CLINICAL IMPRESSION: Patient is a 73 y.o. male who was seen today for physical therapy treatment for chronic left lateral neck pain extending into the rhomboid region and trapezius with some pain radiating down the left arm.  Seems to have possible facet issue that is getting caught causing the sharp shooting pain. His upper traps are over active and he needs cues to relax often. He has some numbness in hand with 3 way reaches. Talked about DN and he would like to try next visit.    Patient reports massage gun was very helpful and relaxing. He does have some larger trigger points in his upper traps that responded well with STM. Pt repots having a at home cervical traction machine. Patient will benefit from skilled PT to address his neck pain and tightness to improve QOL and allow him to exercise and play golf normally.    OBJECTIVE IMPAIRMENTS: decreased ROM, increased  fascial restrictions, increased muscle spasms, impaired flexibility, and pain.   ACTIVITY LIMITATIONS: carrying and lifting  PARTICIPATION LIMITATIONS: yard work and golf and exercising at [employer]  PERSONAL FACTORS: Time since onset of injury/illness/exacerbation are also affecting patient's functional outcome.   REHAB POTENTIAL: Good  CLINICAL DECISION MAKING: Stable/uncomplicated  EVALUATION COMPLEXITY: Low   GOALS: Goals reviewed with patient? Yes  SHORT TERM GOALS: Target date: 08/27/23  Patient will be independent with initial HEP.  Baseline:  Goal status: met 08/23/23   LONG TERM GOALS: Target date: 09/24/23  Patient will be independent with advanced/ongoing HEP to improve outcomes and carryover.  Baseline:  Goal status: INITIAL  2.  Patient will report 75% improvement in neck pain to improve QOL.  Baseline: 7/10 Goal status: Progressing 08/08/23  3.  Patient will demonstrate full pain free cervical ROM. Baseline: see chart Goal status: Progressing 08/08/23  4.  Patient will return to playing golf and doing regular exercise at [facility] without pain  Baseline: slowed down some at gym for now Goal status: INITIAL  PLAN:  PT FREQUENCY: 2x/week  PT DURATION: 8 weeks  PLANNED INTERVENTIONS: 97110-Therapeutic exercises, 97530- Therapeutic activity, 97112- Neuromuscular re-education, 97535- Self Care, 02859- Manual therapy, G0283- Electrical stimulation (unattended), 97012- Traction (mechanical), 20560 (1-2 muscles), 20561 (3+ muscles)- Dry Needling, Patient/Family education, Taping, Joint mobilization, Joint manipulation, Spinal manipulation, Spinal mobilization, Cryotherapy, and Moist heat  PLAN FOR NEXT SESSION: Patient with increased pain and a loss of motion, advised to se MD due to his increased pain after grounding club while golfing on Wednesday, very sore today and frustrated.   [place], PT 08/30/2023, 11:02 AM

## 2023-08-30 ENCOUNTER — Ambulatory Visit: Attending: Family Medicine

## 2023-08-30 DIAGNOSIS — M5412 Radiculopathy, cervical region: Secondary | ICD-10-CM | POA: Insufficient documentation

## 2023-08-30 DIAGNOSIS — M62838 Other muscle spasm: Secondary | ICD-10-CM | POA: Diagnosis not present

## 2023-08-30 DIAGNOSIS — M542 Cervicalgia: Secondary | ICD-10-CM | POA: Diagnosis not present

## 2023-09-03 ENCOUNTER — Ambulatory Visit: Admitting: Physical Therapy

## 2023-09-03 ENCOUNTER — Encounter: Payer: Self-pay | Admitting: Physical Therapy

## 2023-09-03 DIAGNOSIS — M5412 Radiculopathy, cervical region: Secondary | ICD-10-CM

## 2023-09-03 DIAGNOSIS — M542 Cervicalgia: Secondary | ICD-10-CM | POA: Diagnosis not present

## 2023-09-03 DIAGNOSIS — M62838 Other muscle spasm: Secondary | ICD-10-CM

## 2023-09-03 NOTE — Therapy (Signed)
 OUTPATIENT PHYSICAL THERAPY CERVICAL TREATMENT   Patient Name: Patrick Craig MRN: [id number] DOB:06/01/1950, 73 y.o., male Today's Date: 09/03/2023  END OF SESSION:  PT End of Session - 09/03/23 1657     Visit Number 7    Date for PT Re-Evaluation 09/24/23    PT Start Time 1656    PT Stop Time 1751    PT Time Calculation (min) 55 min    Activity Tolerance Patient tolerated treatment well    Behavior During Therapy [facility] for tasks assessed/performed             Past Medical History:  Diagnosis Date   Cellulitis of arm, left 08/06/2014   Chronic bronchitis with emphysema (HCC) 10/2019   CT chest:  Lung RADS 2.  Benign appearance.  Moderate centrilobular emphysema with diffuse bronchial thickening (chronic bronchitis).   Coronary artery calcification seen on CAT scan 10/2019   Chest CT 11/26/2019:  Three-vessel coronary atherosclerosis and aortic atherosclerosis; 03/07/2020: Coronary Calcium  Score 250.   Depression    Erectile dysfunction    History of kidney stones    30 years ago   Hyperlipemia    Recently started on statin   Rosacea    Sleep apnea    Thoracic aorta atherosclerosis (HCC)    Seen on CT scan   Vitamin D deficiency    Past Surgical History:  Procedure Laterality Date   APPENDECTOMY  1983   HERNIA REPAIR Left 2000   inguinal   INCISION AND DRAINAGE ABSCESS     KNEE ARTHROSCOPY Left    Medial meniscus   LUMBAR LAMINECTOMY/DECOMPRESSION MICRODISCECTOMY Right 05/30/2018   Procedure: Right Lumbar Three-Four Laminectomy for synovial cyst;  Surgeon: Onetha Kuba, MD;  Location: [facility] OR;  Service: Neurosurgery;  Laterality: Right;  posterior   SHOULDER SURGERY Bilateral    Clavicle surgeries   Patient Active Problem List   Diagnosis Date Noted   Lumbar radiculopathy 12/12/2022   Elevated blood pressure reading without diagnosis of hypertension 04/24/2020   Coronary artery calcification seen on CAT scan 02/22/2020   Synovial cyst 05/30/2018   OSA  (obstructive sleep apnea) 04/06/2016   ED (erectile dysfunction) 04/20/2015   Muscle cramps 04/20/2015   Cellulitis of arm 08/06/2014   COPD, mild (HCC) 08/06/2014   Depression 08/06/2014   Hyperlipemia, mixed 08/06/2014    PCP: Ryan Hives   REFERRING PROVIDER: Joane Birmingham  REFERRING DIAG: Chronic neck pain and shoulder  THERAPY DIAG:  Cervicalgia  Other muscle spasm  Cervical radiculopathy  Rationale for Evaluation and Treatment: Rehabilitation  ONSET DATE: a couple of months now  SUBJECTIVE:  SUBJECTIVE STATEMENT: I am still doing a little better.  I think this is helping some  EVAL--The neck and shoulder are hurting for a couple of months now. It just started randomly and got worse. I don't recall doing anything to it. I do play golf at least once a week.    PERTINENT HISTORY:  Bilateral clavicle sx, lumbar laminectomy/decompression 2020  PAIN:  Are you having pain? Yes: NPRS scale: 4/10 Pain location: L upper trap, L side of neck Pain description: a little bit of tingling, dull, ache Aggravating factors: rotating it Relieving factors: tylenol  and advil   PRECAUTIONS: None  RED FLAGS: None     WEIGHT BEARING RESTRICTIONS: No  FALLS:  Has patient fallen in last 6 months? No  LIVING ENVIRONMENT: Lives with: lives with their family Lives in: House/apartment  OCCUPATION: retired  PLOF: Independent  PATIENT GOALS: to get rid of my pain  OBJECTIVE:  Note: Objective measures were completed at Evaluation unless otherwise noted.  DIAGNOSTIC FINDINGS:  Moderate disc space narrowing at C5-C6 and severe disc space narrowing at C6-C7. Normal alignment at the cervicothoracic junction. Prevertebral soft tissues are normal. Anterior osteophytes in the cervical  spine. Bilateral facet arthropathy most prominent on the right side at C3-C4 C4-C5  1. 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 2. No acute bone abnormality.   COGNITION: Overall cognitive status: Within functional limits for tasks assessed  SENSATION: WFL  POSTURE: rounded shoulders  PALPATION: Tightness in bilateral upper traps, some radiating sx into L arm when pushing on trigger points    CERVICAL ROM:   Active ROM A/PROM (deg) eval 08/08/23  Flexion WFL with pain on L side WFL  Extension 50% with catch in neck Limited 25%  Right lateral flexion 25% with pain Limited 50%  Left lateral flexion 20% with pain Limited 25%  Right rotation 75% Limited 25%  Left rotation 50% with pain Limited 25%   (Blank rows = not tested)  UPPER EXTREMITY ROM: WFL   UPPER EXTREMITY MMT: 5/5 BUE  TREATMENT DATE: 09/03/23 UBE 3 x 6 minutes Seated row 35# 2x10 LAts 35# 2x10 15# straight arm pulls Green tband ER Weighted ball back to wall OHP STM to the neck and upper traps Occipital release, some manual cervical traction   08/30/23 UBE L3 x13mins each way  Pec stretch in doorway  Seated row 35# 2x12 Lat pull down 35# 2x12 3 way reach with band x10 STM and passive ROM- some use of theragun  08/23/23 STM with tgun and hands left upper trap, rhomboid, and cervical area Supine occipital release Gentle cervical distraction Gentle PROM of the cervical spine Contract relax of side bending and rotation Manual shoulder depression Ice/IFC to the left neck upper trap  08/08/23 UBE L3 x 3 min each STM to UT and cervical spine  passive stretching of neck  STM w/ Thera gun Rows & Lats 35lb 2x10 Shoulder ER green 2x10 Horiz abd green 2x10 Pec stretch 15s x2  08/05/23 UBE L3 x54mins each way  Seated row 25# 2x10 Lat pull down 25# 2x10 Shoulder ext 10# 2x10  Chin tuck 2x10 W backs x10 Horizontal abd red band 2x10 Pec stretch 15s x2 STM to neck and  rhomboids, passive stretching of neck  07/31/23 UBE L2 x40mins each way  Scapular retraction red band 2x10 UT stretch holding 7# weight 30s each side  Shoulder shrugs 5# 2x10  Shoulder rolls 5# 2x10 STM to neck and rhomboids, use of  theragun Cervical traction with MH 12# x68mins  07/30/23 EVAL                                                                                                                                PATIENT EDUCATION:  Education details: POC, HEP, traction Person educated: Patient Education method: Medical illustrator Education comprehension: verbalized understanding and returned demonstration  HOME EXERCISE PROGRAM: Access Code: [email or web address] URL: [email or web address] Date: 07/30/2023 Prepared by: Almetta Fam  Exercises - Seated Levator Scapulae Stretch  - 1 x daily - 7 x weekly - 2 reps - 15 hold - Seated Upper Trapezius Stretch  - 1 x daily - 7 x weekly - 2 reps - 15 hold - Median Nerve Flossing - Tray  - 1 x daily - 7 x weekly - 2 sets - 10 reps - Seated Assisted Cervical Rotation with Towel  - 1 x daily - 7 x weekly - 2 sets - 10 reps - 3 hold - Seated Cervical Traction  - 1 x daily - 7 x weekly - 10 reps - 10 hold  ASSESSMENT:  CLINICAL IMPRESSION: Patient is a 73 y.o. male who was seen today for physical therapy treatment for chronic left lateral neck pain extending into the rhomboid region and trapezius with some pain radiating down the left arm.  He is doing better since the last two visits, he reports less overall pain.  Feels like he has better motion    Patient reports massage gun was very helpful and relaxing. He does have some larger trigger points in his upper traps that responded well with STM. Pt repots having a at home cervical traction machine. Patient will benefit from skilled PT to address his neck pain and tightness to improve QOL and allow him to exercise and play golf normally.    OBJECTIVE IMPAIRMENTS: decreased  ROM, increased fascial restrictions, increased muscle spasms, impaired flexibility, and pain.   ACTIVITY LIMITATIONS: carrying and lifting  PARTICIPATION LIMITATIONS: yard work and golf and exercising at [employer]  PERSONAL FACTORS: Time since onset of injury/illness/exacerbation are also affecting patient's functional outcome.   REHAB POTENTIAL: Good  CLINICAL DECISION MAKING: Stable/uncomplicated  EVALUATION COMPLEXITY: Low   GOALS: Goals reviewed with patient? Yes  SHORT TERM GOALS: Target date: 08/27/23  Patient will be independent with initial HEP.  Baseline:  Goal status: met 08/23/23   LONG TERM GOALS: Target date: 09/24/23  Patient will be independent with advanced/ongoing HEP to improve outcomes and carryover.  Baseline:  Goal status: progressing 09/03/23  2.  Patient will report 75% improvement in neck pain to improve QOL.  Baseline: 7/10 Goal status: Progressing 09/03/23  3.  Patient will demonstrate full pain free cervical ROM. Baseline: see chart Goal status: Progressing 09/03/23  4.  Patient will return to playing golf and doing regular exercise at [facility] without pain  Baseline: slowed down some at gym for now Goal status: INITIAL  PLAN:  PT  FREQUENCY: 2x/week  PT DURATION: 8 weeks  PLANNED INTERVENTIONS: 97110-Therapeutic exercises, 97530- Therapeutic activity, 97112- Neuromuscular re-education, 97535- Self Care, 02859- Manual therapy, G0283- Electrical stimulation (unattended), [phone]- Traction (mechanical), [phone] (1-2 muscles), 20561 (3+ muscles)- Dry Needling, Patient/Family education, Taping, Joint mobilization, Joint manipulation, Spinal manipulation, Spinal mobilization, Cryotherapy, and Moist heat  PLAN FOR NEXT SESSION: try to continue current plan   OBADIAH OZELL ORN, PT 09/03/2023, 4:57 PM

## 2023-09-05 DIAGNOSIS — R0982 Postnasal drip: Secondary | ICD-10-CM | POA: Diagnosis not present

## 2023-09-05 DIAGNOSIS — R030 Elevated blood-pressure reading, without diagnosis of hypertension: Secondary | ICD-10-CM | POA: Diagnosis not present

## 2023-09-05 DIAGNOSIS — H6123 Impacted cerumen, bilateral: Secondary | ICD-10-CM | POA: Diagnosis not present

## 2023-09-10 NOTE — Therapy (Addendum)
 OUTPATIENT PHYSICAL THERAPY CERVICAL TREATMENT   Patient Name: Patrick Craig MRN: [id number] DOB:December 30, 1950, 73 y.o., male Today's Date: 09/11/2023  END OF SESSION:  PT End of Session - 09/11/23 0759     Visit Number 8    Date for PT Re-Evaluation 09/24/23    PT Start Time 0800    PT Stop Time 0845    PT Time Calculation (min) 45 min    Activity Tolerance Patient tolerated treatment well    Behavior During Therapy [facility] for tasks assessed/performed              Past Medical History:  Diagnosis Date   Cellulitis of arm, left 08/06/2014   Chronic bronchitis with emphysema (HCC) 10/2019   CT chest:  Lung RADS 2.  Benign appearance.  Moderate centrilobular emphysema with diffuse bronchial thickening (chronic bronchitis).   Coronary artery calcification seen on CAT scan 10/2019   Chest CT 11/26/2019:  Three-vessel coronary atherosclerosis and aortic atherosclerosis; 03/07/2020: Coronary Calcium  Score 250.   Depression    Erectile dysfunction    History of kidney stones    30 years ago   Hyperlipemia    Recently started on statin   Rosacea    Sleep apnea    Thoracic aorta atherosclerosis (HCC)    Seen on CT scan   Vitamin D deficiency    Past Surgical History:  Procedure Laterality Date   APPENDECTOMY  1983   HERNIA REPAIR Left 2000   inguinal   INCISION AND DRAINAGE ABSCESS     KNEE ARTHROSCOPY Left    Medial meniscus   LUMBAR LAMINECTOMY/DECOMPRESSION MICRODISCECTOMY Right 05/30/2018   Procedure: Right Lumbar Three-Four Laminectomy for synovial cyst;  Surgeon: Onetha Kuba, MD;  Location: [facility] OR;  Service: Neurosurgery;  Laterality: Right;  posterior   SHOULDER SURGERY Bilateral    Clavicle surgeries   Patient Active Problem List   Diagnosis Date Noted   Lumbar radiculopathy 12/12/2022   Elevated blood pressure reading without diagnosis of hypertension 04/24/2020   Coronary artery calcification seen on CAT scan 02/22/2020   Synovial cyst 05/30/2018   OSA  (obstructive sleep apnea) 04/06/2016   ED (erectile dysfunction) 04/20/2015   Muscle cramps 04/20/2015   Cellulitis of arm 08/06/2014   COPD, mild (HCC) 08/06/2014   Depression 08/06/2014   Hyperlipemia, mixed 08/06/2014    PCP: Ryan Hives   REFERRING PROVIDER: Joane Birmingham  REFERRING DIAG: Chronic neck pain and shoulder  THERAPY DIAG:  Cervicalgia  Other muscle spasm  Cervical radiculopathy  Rationale for Evaluation and Treatment: Rehabilitation  ONSET DATE: a couple of months now  SUBJECTIVE:  SUBJECTIVE STATEMENT:  The neck still hurts and is tight. I got some tingling down into my L hand.   PERTINENT HISTORY:  Bilateral clavicle sx, lumbar laminectomy/decompression 2020  PAIN:  Are you having pain? Yes: NPRS scale: 4/10 Pain location: L upper trap, L side of neck Pain description: a little bit of tingling, dull, ache Aggravating factors: rotating it Relieving factors: tylenol  and advil   PRECAUTIONS: None  RED FLAGS: None     WEIGHT BEARING RESTRICTIONS: No  FALLS:  Has patient fallen in last 6 months? No  LIVING ENVIRONMENT: Lives with: lives with their family Lives in: House/apartment  OCCUPATION: retired  PLOF: Independent  PATIENT GOALS: to get rid of my pain  OBJECTIVE:  Note: Objective measures were completed at Evaluation unless otherwise noted.  DIAGNOSTIC FINDINGS:  Moderate disc space narrowing at C5-C6 and severe disc space narrowing at C6-C7. Normal alignment at the cervicothoracic junction. Prevertebral soft tissues are normal. Anterior osteophytes in the cervical spine. Bilateral facet arthropathy most prominent on the right side at C3-C4 C4-C5  1. 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 2. No acute  bone abnormality.   COGNITION: Overall cognitive status: Within functional limits for tasks assessed  SENSATION: WFL  POSTURE: rounded shoulders  PALPATION: Tightness in bilateral upper traps, some radiating sx into L arm when pushing on trigger points    CERVICAL ROM:   Active ROM A/PROM (deg) eval 08/08/23  Flexion WFL with pain on L side WFL  Extension 50% with catch in neck Limited 25%  Right lateral flexion 25% with pain Limited 50%  Left lateral flexion 20% with pain Limited 25%  Right rotation 75% Limited 25%  Left rotation 50% with pain Limited 25%   (Blank rows = not tested)  UPPER EXTREMITY ROM: WFL   UPPER EXTREMITY MMT: 5/5 BUE  TREATMENT DATE: 09/11/23 UBE L3 x47mins  Shoulder ext 15# 2x10  Seated row 35# 2x10 Lat pull down 35# 2x10  DN cervical spine and L upper trap  Trigger Point Dry Needling   Initial Treatment: Pt instructed on Dry Needling rational, procedures, and possible side effects. Pt instructed to expect mild to moderate muscle soreness later in the day and/or into the next day.  Pt instructed in methods to reduce muscle soreness. Pt instructed to continue prescribed HEP. Because Dry Needling was performed over or adjacent to a lung field, pt was educated on S/S of pneumothorax and to seek immediate medical attention should they occur.  Patient was educated on signs and symptoms of infection and other risk factors and advised to seek medical attention should they occur.  Patient verbalized understanding of these instructions and education.    Patient Verbal Consent Given: Yes Education Handout Provided: Yes Muscles Treated: L neck and upper trap Treatment Response/Outcome: LTR's Performed and documented by M. Albright, PT  Pec stretch 30s x2 STM to the neck and upper traps, passive stretching    09/03/23 UBE 3 x 6 minutes Seated row 35# 2x10 LAts 35# 2x10 15# straight arm pulls Green tband ER Weighted ball back to wall OHP STM to  the neck and upper traps Occipital release, some manual cervical traction   08/30/23 UBE L3 x5mins each way  Pec stretch in doorway  Seated row 35# 2x12 Lat pull down 35# 2x12 3 way reach with band x10 STM and passive ROM- some use of theragun  08/23/23 STM with tgun and hands left upper trap, rhomboid, and cervical area Supine occipital  release Gentle cervical distraction Gentle PROM of the cervical spine Contract relax of side bending and rotation Manual shoulder depression Ice/IFC to the left neck upper trap  08/08/23 UBE L3 x 3 min each STM to UT and cervical spine  passive stretching of neck  STM w/ Thera gun Rows & Lats 35lb 2x10 Shoulder ER green 2x10 Horiz abd green 2x10 Pec stretch 15s x2  08/05/23 UBE L3 x49mins each way  Seated row 25# 2x10 Lat pull down 25# 2x10 Shoulder ext 10# 2x10  Chin tuck 2x10 W backs x10 Horizontal abd red band 2x10 Pec stretch 15s x2 STM to neck and rhomboids, passive stretching of neck  07/31/23 UBE L2 x60mins each way  Scapular retraction red band 2x10 UT stretch holding 7# weight 30s each side  Shoulder shrugs 5# 2x10  Shoulder rolls 5# 2x10 STM to neck and rhomboids, use of theragun Cervical traction with MH 12# x77mins  07/30/23 EVAL                                                                                                                                PATIENT EDUCATION:  Education details: POC, HEP, traction Person educated: Patient Education method: Medical illustrator Education comprehension: verbalized understanding and returned demonstration  HOME EXERCISE PROGRAM: Access Code: [email or web address] URL: [email or web address] Date: 07/30/2023 Prepared by: Almetta Fam  Exercises - Seated Levator Scapulae Stretch  - 1 x daily - 7 x weekly - 2 reps - 15 hold - Seated Upper Trapezius Stretch  - 1 x daily - 7 x weekly - 2 reps - 15 hold - Median Nerve Flossing - Tray  - 1 x daily - 7 x weekly - 2  sets - 10 reps - Seated Assisted Cervical Rotation with Towel  - 1 x daily - 7 x weekly - 2 sets - 10 reps - 3 hold - Seated Cervical Traction  - 1 x daily - 7 x weekly - 10 reps - 10 hold  ASSESSMENT:  CLINICAL IMPRESSION: Patient is a 73 y.o. male who was seen today for physical therapy treatment for chronic left lateral neck pain extending into the rhomboid region and trapezius with some pain radiating down the left arm. Today he reports increased pain in the L side of the neck. He requested to try dry needling so we did today. Was educated to use heat and try to keep it moving to avoid soreness.    Patient reports massage gun was very helpful and relaxing. He does have some larger trigger points in his upper traps that responded well with STM. Pt repots having a at home cervical traction machine. Patient will benefit from skilled PT to address his neck pain and tightness to improve QOL and allow him to exercise and play golf normally.    OBJECTIVE IMPAIRMENTS: decreased ROM, increased fascial restrictions, increased muscle spasms, impaired flexibility, and pain.   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   REHAB POTENTIAL: Good  CLINICAL DECISION MAKING: Stable/uncomplicated  EVALUATION COMPLEXITY: Low   GOALS: Goals reviewed with patient? Yes  SHORT TERM GOALS: Target date: 08/27/23  Patient will be independent with initial HEP.  Baseline:  Goal status: met 08/23/23   LONG TERM GOALS: Target date: 09/24/23  Patient will be independent with advanced/ongoing HEP to improve outcomes and carryover.  Baseline:  Goal status: progressing 09/03/23  2.  Patient will report 75% improvement in neck pain to improve QOL.  Baseline: 7/10 Goal status: Progressing 09/03/23  3.  Patient will demonstrate  full pain free cervical ROM. Baseline: see chart Goal status: Progressing 09/03/23  4.  Patient will return to playing golf and doing regular exercise at [facility] without pain  Baseline: slowed down some at gym for now Goal status: INITIAL  PLAN:  PT FREQUENCY: 2x/week  PT DURATION: 8 weeks  PLANNED INTERVENTIONS: 97110-Therapeutic exercises, 97530- Therapeutic activity, 97112- Neuromuscular re-education, 97535- Self Care, 02859- Manual therapy, G0283- Electrical stimulation (unattended), 97012- Traction (mechanical), 20560 (1-2 muscles), 20561 (3+ muscles)- Dry Needling, Patient/Family education, Taping, Joint mobilization, Joint manipulation, Spinal manipulation, Spinal mobilization, Cryotherapy, and Moist heat  PLAN FOR NEXT SESSION: try to continue current plan   Almetta Fam, PT 09/11/2023, 8:45 AM

## 2023-09-11 ENCOUNTER — Ambulatory Visit

## 2023-09-11 DIAGNOSIS — M542 Cervicalgia: Secondary | ICD-10-CM | POA: Diagnosis not present

## 2023-09-11 DIAGNOSIS — M62838 Other muscle spasm: Secondary | ICD-10-CM

## 2023-09-11 DIAGNOSIS — M5412 Radiculopathy, cervical region: Secondary | ICD-10-CM

## 2023-09-11 NOTE — Patient Instructions (Signed)

## 2023-09-18 NOTE — Therapy (Signed)
 OUTPATIENT PHYSICAL THERAPY CERVICAL TREATMENT   Patient Name: Patrick Craig MRN: [id number] DOB:February 06, 1950, 73 y.o., male Today's Date: 09/19/2023  END OF SESSION:  PT End of Session - 09/19/23 1231     Visit Number 9    Date for PT Re-Evaluation 09/24/23    PT Start Time 1230    PT Stop Time 1315    PT Time Calculation (min) 45 min    Activity Tolerance Patient tolerated treatment well    Behavior During Therapy [facility] for tasks assessed/performed               Past Medical History:  Diagnosis Date   Cellulitis of arm, left 08/06/2014   Chronic bronchitis with emphysema (HCC) 10/2019   CT chest:  Lung RADS 2.  Benign appearance.  Moderate centrilobular emphysema with diffuse bronchial thickening (chronic bronchitis).   Coronary artery calcification seen on CAT scan 10/2019   Chest CT 11/26/2019:  Three-vessel coronary atherosclerosis and aortic atherosclerosis; 03/07/2020: Coronary Calcium  Score 250.   Depression    Erectile dysfunction    History of kidney stones    30 years ago   Hyperlipemia    Recently started on statin   Rosacea    Sleep apnea    Thoracic aorta atherosclerosis (HCC)    Seen on CT scan   Vitamin D deficiency    Past Surgical History:  Procedure Laterality Date   APPENDECTOMY  1983   HERNIA REPAIR Left 2000   inguinal   INCISION AND DRAINAGE ABSCESS     KNEE ARTHROSCOPY Left    Medial meniscus   LUMBAR LAMINECTOMY/DECOMPRESSION MICRODISCECTOMY Right 05/30/2018   Procedure: Right Lumbar Three-Four Laminectomy for synovial cyst;  Surgeon: Onetha Kuba, MD;  Location: [facility] OR;  Service: Neurosurgery;  Laterality: Right;  posterior   SHOULDER SURGERY Bilateral    Clavicle surgeries   Patient Active Problem List   Diagnosis Date Noted   Lumbar radiculopathy 12/12/2022   Elevated blood pressure reading without diagnosis of hypertension 04/24/2020   Coronary artery calcification seen on CAT scan 02/22/2020   Synovial cyst 05/30/2018   OSA  (obstructive sleep apnea) 04/06/2016   ED (erectile dysfunction) 04/20/2015   Muscle cramps 04/20/2015   Cellulitis of arm 08/06/2014   COPD, mild (HCC) 08/06/2014   Depression 08/06/2014   Hyperlipemia, mixed 08/06/2014    PCP: Ryan Hives   REFERRING PROVIDER: Joane Birmingham  REFERRING DIAG: Chronic neck pain and shoulder  THERAPY DIAG:  Cervical radiculopathy  Cervicalgia  Other muscle spasm  Rationale for Evaluation and Treatment: Rehabilitation  ONSET DATE: a couple of months now  SUBJECTIVE:  SUBJECTIVE STATEMENT:  I felt good after about 4-5 days from the needling but it came right back. Still having pain, had a rough night last time. Still having to take 2-3 Advil a day.   PERTINENT HISTORY:  Bilateral clavicle sx, lumbar laminectomy/decompression 2020  PAIN:  Are you having pain? Yes: NPRS scale: 4/10 Pain location: L upper trap, L side of neck Pain description: a little bit of tingling, dull, ache Aggravating factors: rotating it Relieving factors: tylenol  and advil   PRECAUTIONS: None  RED FLAGS: None     WEIGHT BEARING RESTRICTIONS: No  FALLS:  Has patient fallen in last 6 months? No  LIVING ENVIRONMENT: Lives with: lives with their family Lives in: House/apartment  OCCUPATION: retired  PLOF: Independent  PATIENT GOALS: to get rid of my pain  OBJECTIVE:  Note: Objective measures were completed at Evaluation unless otherwise noted.  DIAGNOSTIC FINDINGS:  Moderate disc space narrowing at C5-C6 and severe disc space narrowing at C6-C7. Normal alignment at the cervicothoracic junction. Prevertebral soft tissues are normal. Anterior osteophytes in the cervical spine. Bilateral facet arthropathy most prominent on the right side at C3-C4 C4-C5  1.  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 2. No acute bone abnormality.   COGNITION: Overall cognitive status: Within functional limits for tasks assessed  SENSATION: WFL  POSTURE: rounded shoulders  PALPATION: Tightness in bilateral upper traps, some radiating sx into L arm when pushing on trigger points    CERVICAL ROM:   Active ROM A/PROM (deg) eval 08/08/23  Flexion WFL with pain on L side WFL  Extension 50% with catch in neck Limited 25%  Right lateral flexion 25% with pain Limited 50%  Left lateral flexion 20% with pain Limited 25%  Right rotation 75% Limited 25%  Left rotation 50% with pain Limited 25%   (Blank rows = not tested)  UPPER EXTREMITY ROM: WFL   UPPER EXTREMITY MMT: 5/5 BUE  TREATMENT DATE: 09/19/23 UBE L3 x31mins each way  Shoulder ext 15# 2x10 Bicep curl 35# 2x10 Tricep ext 45# 2x10 3 way ext with 3# weight 2x10 Horizontal abd green band 2x10 STM to upper traps, tried some gentle unilateral glides to L side c-spine   09/11/23 UBE L3 x31mins  Shoulder ext 15# 2x10  Seated row 35# 2x10 Lat pull down 35# 2x10  DN cervical spine and L upper trap  Trigger Point Dry Needling   Initial Treatment: Pt instructed on Dry Needling rational, procedures, and possible side effects. Pt instructed to expect mild to moderate muscle soreness later in the day and/or into the next day.  Pt instructed in methods to reduce muscle soreness. Pt instructed to continue prescribed HEP. Because Dry Needling was performed over or adjacent to a lung field, pt was educated on S/S of pneumothorax and to seek immediate medical attention should they occur.  Patient was educated on signs and symptoms of infection and other risk factors and advised to seek medical attention should they occur.  Patient verbalized understanding of these instructions and education.    Patient Verbal Consent Given: Yes Education Handout Provided: Yes Muscles Treated: L  neck and upper trap Treatment Response/Outcome: LTR's Performed and documented by M. Albright, PT  Pec stretch 30s x2 STM to the neck and upper traps, passive stretching    09/03/23 UBE 3 x 6 minutes Seated row 35# 2x10 LAts 35# 2x10 15# straight arm pulls Green tband ER Weighted ball back to wall OHP STM to the neck  and upper traps Occipital release, some manual cervical traction   08/30/23 UBE L3 x34mins each way  Pec stretch in doorway  Seated row 35# 2x12 Lat pull down 35# 2x12 3 way reach with band x10 STM and passive ROM- some use of theragun  08/23/23 STM with tgun and hands left upper trap, rhomboid, and cervical area Supine occipital release Gentle cervical distraction Gentle PROM of the cervical spine Contract relax of side bending and rotation Manual shoulder depression Ice/IFC to the left neck upper trap  08/08/23 UBE L3 x 3 min each STM to UT and cervical spine  passive stretching of neck  STM w/ Thera gun Rows & Lats 35lb 2x10 Shoulder ER green 2x10 Horiz abd green 2x10 Pec stretch 15s x2  08/05/23 UBE L3 x61mins each way  Seated row 25# 2x10 Lat pull down 25# 2x10 Shoulder ext 10# 2x10  Chin tuck 2x10 W backs x10 Horizontal abd red band 2x10 Pec stretch 15s x2 STM to neck and rhomboids, passive stretching of neck  07/31/23 UBE L2 x88mins each way  Scapular retraction red band 2x10 UT stretch holding 7# weight 30s each side  Shoulder shrugs 5# 2x10  Shoulder rolls 5# 2x10 STM to neck and rhomboids, use of theragun Cervical traction with MH 12# x43mins  07/30/23 EVAL                                                                                                                                PATIENT EDUCATION:  Education details: POC, HEP, traction Person educated: Patient Education method: Medical illustrator Education comprehension: verbalized understanding and returned demonstration  HOME EXERCISE PROGRAM: Access Code:  [email or web address] URL: [email or web address] Date: 07/30/2023 Prepared by: Almetta Fam  Exercises - Seated Levator Scapulae Stretch  - 1 x daily - 7 x weekly - 2 reps - 15 hold - Seated Upper Trapezius Stretch  - 1 x daily - 7 x weekly - 2 reps - 15 hold - Median Nerve Flossing - Tray  - 1 x daily - 7 x weekly - 2 sets - 10 reps - Seated Assisted Cervical Rotation with Towel  - 1 x daily - 7 x weekly - 2 sets - 10 reps - 3 hold - Seated Cervical Traction  - 1 x daily - 7 x weekly - 10 reps - 10 hold  3 way extension with light dumbbell  ASSESSMENT:  CLINICAL IMPRESSION: Patient is a 73 y.o. male who was seen today for physical therapy treatment for chronic left lateral neck pain extending into the rhomboid region and trapezius with some pain radiating N/T in the left arm. He reports the pain in the neck is aggravating. The muscle pain and trigger points go away but the joint pain in the neck has not went away. I think it may be advanced arthritis and stenosis/narrowing that may be causing his pain and compression and he may need to return for an MRI to  see what other options he has.    Patient reports massage gun was very helpful and relaxing. He does have some larger trigger points in his upper traps that responded well with STM. Pt repots having a at home cervical traction machine. Patient will benefit from skilled PT to address his neck pain and tightness to improve QOL and allow him to exercise and play golf normally.    OBJECTIVE IMPAIRMENTS: decreased ROM, increased fascial restrictions, increased muscle spasms, impaired flexibility, and pain.   ACTIVITY LIMITATIONS: carrying and lifting  PARTICIPATION LIMITATIONS: yard work and golf and exercising at [employer]  PERSONAL FACTORS: Time since onset of injury/illness/exacerbation are also affecting patient's functional outcome.   REHAB POTENTIAL: Good  CLINICAL DECISION MAKING: Stable/uncomplicated  EVALUATION COMPLEXITY:  Low   GOALS: Goals reviewed with patient? Yes  SHORT TERM GOALS: Target date: 08/27/23  Patient will be independent with initial HEP.  Baseline:  Goal status: met 08/23/23   LONG TERM GOALS: Target date: 09/24/23  Patient will be independent with advanced/ongoing HEP to improve outcomes and carryover.  Baseline:  Goal status: progressing 09/03/23  2.  Patient will report 75% improvement in neck pain to improve QOL.  Baseline: 7/10 Goal status: Progressing 09/03/23  3.  Patient will demonstrate full pain free cervical ROM. Baseline: see chart Goal status: Progressing 09/03/23  4.  Patient will return to playing golf and doing regular exercise at [facility] without pain  Baseline: slowed down some at gym for now Goal status: IN PROGRESS tried playing golf but had pain 09/19/23  PLAN:  PT FREQUENCY: 2x/week  PT DURATION: 8 weeks  PLANNED INTERVENTIONS: 97110-Therapeutic exercises, 97530- Therapeutic activity, 97112- Neuromuscular re-education, 97535- Self Care, 02859- Manual therapy, G0283- Electrical stimulation (unattended), 97012- Traction (mechanical), 20560 (1-2 muscles), 20561 (3+ muscles)- Dry Needling, Patient/Family education, Taping, Joint mobilization, Joint manipulation, Spinal manipulation, Spinal mobilization, Cryotherapy, and Moist heat  PLAN FOR NEXT SESSION: 10th visit progress note, DN again if he wants, d/c if he is feeling better   Almetta Fam, PT 09/19/2023, 1:12 PM

## 2023-09-19 ENCOUNTER — Ambulatory Visit

## 2023-09-19 DIAGNOSIS — M542 Cervicalgia: Secondary | ICD-10-CM

## 2023-09-19 DIAGNOSIS — M62838 Other muscle spasm: Secondary | ICD-10-CM

## 2023-09-19 DIAGNOSIS — M5412 Radiculopathy, cervical region: Secondary | ICD-10-CM

## 2023-09-23 DIAGNOSIS — I10 Essential (primary) hypertension: Secondary | ICD-10-CM | POA: Diagnosis not present

## 2023-09-24 ENCOUNTER — Telehealth: Payer: Self-pay | Admitting: Family Medicine

## 2023-09-24 ENCOUNTER — Encounter: Payer: Self-pay | Admitting: Physical Therapy

## 2023-09-24 ENCOUNTER — Ambulatory Visit: Admitting: Physical Therapy

## 2023-09-24 DIAGNOSIS — M542 Cervicalgia: Secondary | ICD-10-CM | POA: Diagnosis not present

## 2023-09-24 DIAGNOSIS — M62838 Other muscle spasm: Secondary | ICD-10-CM

## 2023-09-24 DIAGNOSIS — G8929 Other chronic pain: Secondary | ICD-10-CM

## 2023-09-24 DIAGNOSIS — M5412 Radiculopathy, cervical region: Secondary | ICD-10-CM

## 2023-09-24 NOTE — Telephone Encounter (Signed)
 Pt called, per last OV he is ready for a MRI.  Has been doing PT and Dry Needling, no improvement, possible worsening.

## 2023-09-24 NOTE — Therapy (Signed)
 OUTPATIENT PHYSICAL THERAPY CERVICAL TREATMENT Progress Note Reporting Period 07/30/23 to 09/24/23  See note below for Objective Data and Assessment of Progress/Goals.      Patient Name: Patrick Craig MRN: [id number] DOB:1950/03/19, 73 y.o., male Today's Date: 09/24/2023  END OF SESSION:  PT End of Session - 09/24/23 0928     Visit Number 10    Date for PT Re-Evaluation 10/27/23    Authorization Type HTA    PT Start Time 0928    PT Stop Time 1014    PT Time Calculation (min) 46 min    Activity Tolerance Patient tolerated treatment well    Behavior During Therapy [facility] for tasks assessed/performed               Past Medical History:  Diagnosis Date   Cellulitis of arm, left 08/06/2014   Chronic bronchitis with emphysema (HCC) 10/2019   CT chest:  Lung RADS 2.  Benign appearance.  Moderate centrilobular emphysema with diffuse bronchial thickening (chronic bronchitis).   Coronary artery calcification seen on CAT scan 10/2019   Chest CT 11/26/2019:  Three-vessel coronary atherosclerosis and aortic atherosclerosis; 03/07/2020: Coronary Calcium  Score 250.   Depression    Erectile dysfunction    History of kidney stones    30 years ago   Hyperlipemia    Recently started on statin   Rosacea    Sleep apnea    Thoracic aorta atherosclerosis (HCC)    Seen on CT scan   Vitamin D deficiency    Past Surgical History:  Procedure Laterality Date   APPENDECTOMY  1983   HERNIA REPAIR Left 2000   inguinal   INCISION AND DRAINAGE ABSCESS     KNEE ARTHROSCOPY Left    Medial meniscus   LUMBAR LAMINECTOMY/DECOMPRESSION MICRODISCECTOMY Right 05/30/2018   Procedure: Right Lumbar Three-Four Laminectomy for synovial cyst;  Surgeon: Onetha Kuba, MD;  Location: [facility] OR;  Service: Neurosurgery;  Laterality: Right;  posterior   SHOULDER SURGERY Bilateral    Clavicle surgeries   Patient Active Problem List   Diagnosis Date Noted   Lumbar radiculopathy 12/12/2022   Elevated blood  pressure reading without diagnosis of hypertension 04/24/2020   Coronary artery calcification seen on CAT scan 02/22/2020   Synovial cyst 05/30/2018   OSA (obstructive sleep apnea) 04/06/2016   ED (erectile dysfunction) 04/20/2015   Muscle cramps 04/20/2015   Cellulitis of arm 08/06/2014   COPD, mild (HCC) 08/06/2014   Depression 08/06/2014   Hyperlipemia, mixed 08/06/2014    PCP: Ryan Hives   REFERRING PROVIDER: Joane Birmingham  REFERRING DIAG: Chronic neck pain and shoulder  THERAPY DIAG:  Cervical radiculopathy  Cervicalgia  Other muscle spasm  Rationale for Evaluation and Treatment: Rehabilitation  ONSET DATE: a couple of months now  SUBJECTIVE:  SUBJECTIVE STATEMENT: I feel good for a while but the pain comes right back, when I hold good posture I feel some pain down the arm to the hand which makes me want to lean forward  PERTINENT HISTORY:  Bilateral clavicle sx, lumbar laminectomy/decompression 2020  PAIN:  Are you having pain? Yes: NPRS scale: 4/10 Pain location: L upper trap, L side of neck Pain description: a little bit of tingling, dull, ache Aggravating factors: rotating it Relieving factors: tylenol  and advil   PRECAUTIONS: None  RED FLAGS: None     WEIGHT BEARING RESTRICTIONS: No  FALLS:  Has patient fallen in last 6 months? No  LIVING ENVIRONMENT: Lives with: lives with their family Lives in: House/apartment  OCCUPATION: retired  PLOF: Independent  PATIENT GOALS: to get rid of my pain  OBJECTIVE:  Note: Objective measures were completed at Evaluation unless otherwise noted.  DIAGNOSTIC FINDINGS:  Moderate disc space narrowing at C5-C6 and severe disc space narrowing at C6-C7. Normal alignment at the cervicothoracic junction. Prevertebral  soft tissues are normal. Anterior osteophytes in the cervical spine. Bilateral facet arthropathy most prominent on the right side at C3-C4 C4-C5  1. 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 2. No acute bone abnormality.   COGNITION: Overall cognitive status: Within functional limits for tasks assessed  SENSATION: WFL  POSTURE: rounded shoulders  PALPATION: Tightness in bilateral upper traps, some radiating sx into L arm when pushing on trigger points    CERVICAL ROM:   Active ROM A/PROM (deg) eval 08/08/23  Flexion WFL with pain on L side WFL  Extension 50% with catch in neck Limited 25%  Right lateral flexion 25% with pain Limited 50%  Left lateral flexion 20% with pain Limited 25%  Right rotation 75% Limited 25%  Left rotation 50% with pain Limited 25%   (Blank rows = not tested)  UPPER EXTREMITY ROM: WFL   UPPER EXTREMITY MMT: 5/5 BUE  TREATMENT DATE: 09/24/23 UBE level 5 x 6 minutes Rows 35# Lats 45# Green tband ER Green tband horizontal abd Reviewed HEP, posture and body mechanics STM to the upper traps, rhomboids, and cervical area Cervical traction static 14#  09/19/23 UBE L3 x28mins each way  Shoulder ext 15# 2x10 Bicep curl 35# 2x10 Tricep ext 45# 2x10 3 way ext with 3# weight 2x10 Horizontal abd green band 2x10 STM to upper traps, tried some gentle unilateral glides to L side c-spine   09/11/23 UBE L3 x12mins  Shoulder ext 15# 2x10  Seated row 35# 2x10 Lat pull down 35# 2x10  DN cervical spine and L upper trap  Trigger Point Dry Needling   Initial Treatment: Pt instructed on Dry Needling rational, procedures, and possible side effects. Pt instructed to expect mild to moderate muscle soreness later in the day and/or into the next day.  Pt instructed in methods to reduce muscle soreness. Pt instructed to continue prescribed HEP. Because Dry Needling was performed over or adjacent to a lung field, pt was educated on S/S  of pneumothorax and to seek immediate medical attention should they occur.  Patient was educated on signs and symptoms of infection and other risk factors and advised to seek medical attention should they occur.  Patient verbalized understanding of these instructions and education.    Patient Verbal Consent Given: Yes Education Handout Provided: Yes Muscles Treated: L neck and upper trap Treatment Response/Outcome: LTR's Performed and documented by M. Madicyn Mesina, PT  Pec stretch 30s x2 STM to the neck  and upper traps, passive stretching    09/03/23 UBE 3 x 6 minutes Seated row 35# 2x10 LAts 35# 2x10 15# straight arm pulls Green tband ER Weighted ball back to wall OHP STM to the neck and upper traps Occipital release, some manual cervical traction   08/30/23 UBE L3 x23mins each way  Pec stretch in doorway  Seated row 35# 2x12 Lat pull down 35# 2x12 3 way reach with band x10 STM and passive ROM- some use of theragun  08/23/23 STM with tgun and hands left upper trap, rhomboid, and cervical area Supine occipital release Gentle cervical distraction Gentle PROM of the cervical spine Contract relax of side bending and rotation Manual shoulder depression Ice/IFC to the left neck upper trap  08/08/23 UBE L3 x 3 min each STM to UT and cervical spine  passive stretching of neck  STM w/ Thera gun Rows & Lats 35lb 2x10 Shoulder ER green 2x10 Horiz abd green 2x10 Pec stretch 15s x2  08/05/23 UBE L3 x57mins each way  Seated row 25# 2x10 Lat pull down 25# 2x10 Shoulder ext 10# 2x10  Chin tuck 2x10 W backs x10 Horizontal abd red band 2x10 Pec stretch 15s x2 STM to neck and rhomboids, passive stretching of neck  07/31/23 UBE L2 x58mins each way  Scapular retraction red band 2x10 UT stretch holding 7# weight 30s each side  Shoulder shrugs 5# 2x10  Shoulder rolls 5# 2x10 STM to neck and rhomboids, use of theragun Cervical traction with MH 12# x66mins  07/30/23 EVAL                                                                                                                                 PATIENT EDUCATION:  Education details: POC, HEP, traction Person educated: Patient Education method: Medical illustrator Education comprehension: verbalized understanding and returned demonstration  HOME EXERCISE PROGRAM: Access Code: [email or web address] URL: [email or web address] Date: 07/30/2023 Prepared by: Almetta Fam  Exercises - Seated Levator Scapulae Stretch  - 1 x daily - 7 x weekly - 2 reps - 15 hold - Seated Upper Trapezius Stretch  - 1 x daily - 7 x weekly - 2 reps - 15 hold - Median Nerve Flossing - Tray  - 1 x daily - 7 x weekly - 2 sets - 10 reps - Seated Assisted Cervical Rotation with Towel  - 1 x daily - 7 x weekly - 2 sets - 10 reps - 3 hold - Seated Cervical Traction  - 1 x daily - 7 x weekly - 10 reps - 10 hold  3 way extension with light dumbbell  ASSESSMENT:  CLINICAL IMPRESSION: Patient is a 73 y.o. male who was seen today for physical therapy treatment for chronic left lateral neck pain extending into the rhomboid region and trapezius with some pain radiating N/T in the left arm. He has better ROM and for the most part has times when he feels good after therapy  but reports that the pain returns.  We discussed that he may need to return to the MD.  He said he would call   Patient reports massage gun was very helpful and relaxing. He does have some larger trigger points in his upper traps that responded well with STM. Pt repots having a at home cervical traction machine. Patient will benefit from skilled PT to address his neck pain and tightness to improve QOL and allow him to exercise and play golf normally.    OBJECTIVE IMPAIRMENTS: decreased ROM, increased fascial restrictions, increased muscle spasms, impaired flexibility, and pain.   ACTIVITY LIMITATIONS: carrying and lifting  PARTICIPATION LIMITATIONS: yard work and golf and  exercising at [employer]  PERSONAL FACTORS: Time since onset of injury/illness/exacerbation are also affecting patient's functional outcome.   REHAB POTENTIAL: Good  CLINICAL DECISION MAKING: Stable/uncomplicated  EVALUATION COMPLEXITY: Low   GOALS: Goals reviewed with patient? Yes  SHORT TERM GOALS: Target date: 08/27/23  Patient will be independent with initial HEP.  Baseline:  Goal status: met 08/23/23   LONG TERM GOALS: Target date: 09/24/23  Patient will be independent with advanced/ongoing HEP to improve outcomes and carryover.  Baseline:  Goal status: progressing 09/24/23  2.  Patient will report 75% improvement in neck pain to improve QOL.  Baseline: 7/10 Goal status: Progressing 09/24/23  3.  Patient will demonstrate full pain free cervical ROM. Baseline: see chart Goal status: Progressing 09/24/23  4.  Patient will return to playing golf and doing regular exercise at [facility] without pain  Baseline: slowed down some at gym for now Goal status: IN PROGRESS tried playing golf but had pain 09/24/23  PLAN:  PT FREQUENCY: 2x/week  PT DURATION: 8 weeks  PLANNED INTERVENTIONS: 97110-Therapeutic exercises, 97530- Therapeutic activity, 97112- Neuromuscular re-education, 97535- Self Care, [id number]- Manual therapy, [id number]- Electrical stimulation (unattended), 97012- Traction (mechanical), 20560 (1-2 muscles), 20561 (3+ muscles)- Dry Needling, Patient/Family education, Taping, Joint mobilization, Joint manipulation, Spinal manipulation, Spinal mobilization, Cryotherapy, and Moist heat  PLAN FOR NEXT SESSION: 10 visit and renewal done, he is to call MD, we will hold until he finds out something unless he thinks this is helping, as if he stops will the pain worsen   Arnett Duddy W, PT 09/24/2023, 9:29 AM

## 2023-09-26 ENCOUNTER — Ambulatory Visit
Admission: RE | Admit: 2023-09-26 | Discharge: 2023-09-26 | Disposition: A | Discharge status: Home / Self Care | Source: Ambulatory Visit | Attending: Family Medicine | Admitting: Family Medicine

## 2023-09-26 DIAGNOSIS — G8929 Other chronic pain: Secondary | ICD-10-CM

## 2023-09-26 DIAGNOSIS — M4312 Spondylolisthesis, cervical region: Secondary | ICD-10-CM | POA: Diagnosis not present

## 2023-09-26 DIAGNOSIS — M4723 Other spondylosis with radiculopathy, cervicothoracic region: Secondary | ICD-10-CM | POA: Diagnosis not present

## 2023-09-26 DIAGNOSIS — M50123 Cervical disc disorder at C6-C7 level with radiculopathy: Secondary | ICD-10-CM | POA: Diagnosis not present

## 2023-09-26 DIAGNOSIS — M4802 Spinal stenosis, cervical region: Secondary | ICD-10-CM | POA: Diagnosis not present

## 2023-10-07 ENCOUNTER — Ambulatory Visit: Payer: Self-pay | Admitting: Family Medicine

## 2023-10-07 NOTE — Addendum Note (Signed)
 Addended by: JOANE ARTIST RAMAN on: 10/07/2023 07:11 AM   Modules accepted: Orders

## 2023-10-07 NOTE — Progress Notes (Signed)
 Cervical spine MRI does show some areas where the nerves are being pinched and arthritis.  I will refer to Dr. Darlis as we discussed.

## 2023-10-17 DIAGNOSIS — M47812 Spondylosis without myelopathy or radiculopathy, cervical region: Secondary | ICD-10-CM | POA: Diagnosis not present

## 2023-10-17 DIAGNOSIS — M5412 Radiculopathy, cervical region: Secondary | ICD-10-CM | POA: Diagnosis not present

## 2023-10-24 DIAGNOSIS — I10 Essential (primary) hypertension: Secondary | ICD-10-CM | POA: Diagnosis not present

## 2023-10-24 DIAGNOSIS — T24101A Burn of first degree of unspecified site of right lower limb, except ankle and foot, initial encounter: Secondary | ICD-10-CM | POA: Diagnosis not present

## 2023-10-28 DIAGNOSIS — H40053 Ocular hypertension, bilateral: Secondary | ICD-10-CM | POA: Diagnosis not present

## 2023-10-28 DIAGNOSIS — H2513 Age-related nuclear cataract, bilateral: Secondary | ICD-10-CM | POA: Diagnosis not present

## 2023-10-28 DIAGNOSIS — H5213 Myopia, bilateral: Secondary | ICD-10-CM | POA: Diagnosis not present

## 2023-10-31 DIAGNOSIS — M47812 Spondylosis without myelopathy or radiculopathy, cervical region: Secondary | ICD-10-CM | POA: Diagnosis not present

## 2023-11-28 DIAGNOSIS — M47812 Spondylosis without myelopathy or radiculopathy, cervical region: Secondary | ICD-10-CM | POA: Diagnosis not present

## 2023-11-28 DIAGNOSIS — Z6828 Body mass index (BMI) 28.0-28.9, adult: Secondary | ICD-10-CM | POA: Diagnosis not present

## 2023-12-13 DIAGNOSIS — Z23 Encounter for immunization: Secondary | ICD-10-CM | POA: Diagnosis not present

## 2023-12-13 DIAGNOSIS — J4489 Other specified chronic obstructive pulmonary disease: Secondary | ICD-10-CM | POA: Diagnosis not present

## 2023-12-27 ENCOUNTER — Other Ambulatory Visit: Payer: Self-pay | Admitting: Family Medicine

## 2023-12-30 NOTE — Telephone Encounter (Signed)
 Last OV 08/28/23 Next OV not scheduled  Last refill 12/12/22 Qty #90/3  Reported pt not taking.

## 2024-02-12 ENCOUNTER — Other Ambulatory Visit: Payer: Self-pay

## 2024-02-12 ENCOUNTER — Telehealth: Payer: Self-pay | Admitting: Family Medicine

## 2024-02-12 DIAGNOSIS — M5416 Radiculopathy, lumbar region: Secondary | ICD-10-CM

## 2024-02-12 DIAGNOSIS — G5602 Carpal tunnel syndrome, left upper limb: Secondary | ICD-10-CM

## 2024-02-12 MED ORDER — GABAPENTIN 300 MG PO CAPS: 300.0000 mg | ORAL_CAPSULE | Freq: Every evening | ORAL | 3 refills | Status: AC | PRN

## 2024-02-12 NOTE — Telephone Encounter (Signed)
 Patient called requesting a refill on: gabapentin  (NEURONTIN ) 300 MG capsule  He said that he is only taking one capsules once in the evening.  Because he has not been taking as often as it was prescribed, the pharmacy will not refill based on the last prescription in November.  Can a new prescription be sent in?  Pharmacy: [facility]
